# Patient Record
Sex: Female | Born: 1955
Health system: Southern US, Community
[De-identification: ages and names within clinical notes are randomized; demographics above are authoritative.]

## PROBLEM LIST (undated history)

## (undated) DIAGNOSIS — I10 Essential (primary) hypertension: Secondary | ICD-10-CM

## (undated) HISTORY — PX: DILATION AND CURETTAGE OF UTERUS: SHX78

## (undated) HISTORY — PX: EXPLORATORY LAPAROTOMY: SUR591

## (undated) HISTORY — PX: BREAST BIOPSY: SHX20

## (undated) HISTORY — PX: ECTOPIC PREGNANCY SURGERY: SHX613

## (undated) HISTORY — DX: Essential (primary) hypertension: I10

---

## 2018-10-04 ENCOUNTER — Telehealth: Payer: Self-pay | Admitting: Family Medicine

## 2018-10-04 ENCOUNTER — Telehealth: Payer: Self-pay

## 2018-10-04 NOTE — Telephone Encounter (Signed)
Copied from [place] [id number]. Topic: Appointment Scheduling - New Patient >> Oct 04, 2018  3:16 PM Berneta Levins wrote: New patient has been scheduled for your office. Provider: Dr. Nani Ravens Date of Appointment: 10/26/2018  Route to department's PEC pool.

## 2018-10-04 NOTE — Telephone Encounter (Signed)
Copied from [place] [id number]. Topic: Appointment Scheduling - New Patient >> Oct 04, 2018  3:16 PM Berneta Levins wrote: New patient has been scheduled for your office. Provider: Dr. Nani Ravens Date of Appointment: 10/26/2018  Route to department's PEC pool.

## 2018-10-04 NOTE — Telephone Encounter (Signed)
Copied from [place] [id number]. Topic: Appointment Scheduling - New Patient >> Oct 04, 2018  3:16 PM Berneta Levins wrote: New patient has been scheduled for your office. Provider: Dr. Nani Ravens Date of Appointment: 10/26/2018  Route to department's PEC pool.

## 2018-10-10 ENCOUNTER — Telehealth: Payer: Self-pay | Admitting: Family Medicine

## 2018-10-10 NOTE — Telephone Encounter (Signed)
Copied from [place] [phone]. Topic: Quick Communication - Rx Refill/Question >> Oct 10, 2018  3:37 PM Leward Quan A wrote: Medication: Amlodipine 5 mg tablets  Has the patient contacted their pharmacy? Yes.   (Agent: If no, request that the patient contact the pharmacy for the refill.) (Agent: If yes, when and what did the pharmacy advise?)  Preferred Pharmacy (with phone number or street name): [place], [place]. [phone] (Phone) [phone] (Fax)    Agent: Please be advised that RX refills may take up to 3 business days. We ask that you follow-up with your pharmacy.

## 2018-10-10 NOTE — Telephone Encounter (Signed)
Pt is scheduled for an OV to speak with Dr. Nani Ravens About medication until Pt has her New patient appt

## 2018-10-10 NOTE — Telephone Encounter (Signed)
Patient called, left VM to return call to the office to discuss medication request.

## 2018-10-13 ENCOUNTER — Encounter: Payer: Self-pay | Admitting: Family Medicine

## 2018-10-13 ENCOUNTER — Ambulatory Visit: Payer: 59 | Admitting: Family Medicine

## 2018-10-13 VITALS — BP 132/78 | HR 71 | Temp 97.5°F | Ht 66.0 in | Wt 137.5 lb

## 2018-10-13 DIAGNOSIS — E7800 Pure hypercholesterolemia, unspecified: Secondary | ICD-10-CM | POA: Insufficient documentation

## 2018-10-13 DIAGNOSIS — R7303 Prediabetes: Secondary | ICD-10-CM

## 2018-10-13 DIAGNOSIS — I10 Essential (primary) hypertension: Secondary | ICD-10-CM | POA: Insufficient documentation

## 2018-10-13 MED ORDER — AMLODIPINE BESYLATE 5 MG PO TABS: 5.0000 mg | ORAL_TABLET | Freq: Every day | ORAL | 3 refills | Status: DC

## 2018-10-13 NOTE — Progress Notes (Signed)
Chief Complaint  Patient presents with  . New Patient (Initial Visit)    Subjective Nancy Callahan is a 63 y.o. female who presents for hypertension follow up. She does not routinely monitor home blood pressures. Blood pressures ranging from 130's/70's on average. She is compliant with medication- Norvasc 5 mg/d. Patient has these side effects of medication: none She is not consistently adhering to a healthy diet overall. Current exercise: [employer] 2-3x/week  +hx of prediabetes.  +hx of hyperlipidemia. Has never been on a cholesterol medicine.    Past Medical History:  Diagnosis Date  . Hypertension     Review of Systems Cardiovascular: no chest pain Respiratory:  no shortness of breath  Exam BP 132/78 (BP Location: Left Arm, Patient Position: Sitting, Cuff Size: Normal)   Pulse 71   Temp (!) 97.5 F (36.4 C) (Oral)   Ht 5\' 6"  (1.676 m)   Wt 137 lb 8 oz (62.4 kg)   BMI 22.19 kg/m  General:  well developed, well nourished, in no apparent distress Heart: RRR, no bruits, no LE edema Lungs: clear to auscultation, no accessory muscle use Psych: well oriented with normal range of affect and appropriate judgment/insight  Essential hypertension - Plan: amLODipine (NORVASC) 5 MG tablet  Prediabetes  Pure hypercholesterolemia  Orders as above. Cont above. Counseled on diet and exercise. F/u in 2 weeks for CPE as originally scheduled. The patient voiced understanding and agreement to the plan.  [place], DO 10/13/18  1:54 PM

## 2018-10-13 NOTE — Patient Instructions (Addendum)
Keep the diet clean and stay active.  You can stop checking your blood pressure at home if you'd like.   Let us know if you need anything.

## 2018-10-13 NOTE — Progress Notes (Signed)
Pre visit review using our clinic review tool, if applicable. No additional management support is needed unless otherwise documented below in the visit note. 

## 2018-10-26 ENCOUNTER — Encounter: Payer: Self-pay | Admitting: Family Medicine

## 2018-10-26 ENCOUNTER — Ambulatory Visit: Payer: 59 | Admitting: Family Medicine

## 2018-10-26 VITALS — BP 118/80 | HR 64 | Temp 97.7°F | Ht 66.0 in | Wt 132.5 lb

## 2018-10-26 DIAGNOSIS — D369 Benign neoplasm, unspecified site: Secondary | ICD-10-CM | POA: Diagnosis not present

## 2018-10-26 DIAGNOSIS — Z0000 Encounter for general adult medical examination without abnormal findings: Secondary | ICD-10-CM

## 2018-10-26 DIAGNOSIS — Z1211 Encounter for screening for malignant neoplasm of colon: Secondary | ICD-10-CM

## 2018-10-26 LAB — COMPREHENSIVE METABOLIC PANEL
ALT: 21 U/L (ref 0–35)
AST: 17 U/L (ref 0–37)
Albumin: 4.7 g/dL (ref 3.5–5.2)
Alkaline Phosphatase: 68 U/L (ref 39–117)
BUN: 14 mg/dL (ref 6–23)
CO2: 31 mEq/L (ref 19–32)
CREATININE: 0.75 mg/dL (ref 0.40–1.20)
Calcium: 10 mg/dL (ref 8.4–10.5)
Chloride: 102 mEq/L (ref 96–112)
GFR: 78.66 mL/min (ref >60.00)
Glucose, Bld: 108 mg/dL — ABNORMAL HIGH (ref 70–99)
Potassium: 4.5 mEq/L (ref 3.5–5.1)
SODIUM: 140 meq/L (ref 135–145)
Total Bilirubin: 0.6 mg/dL (ref 0.2–1.2)
Total Protein: 7.8 g/dL (ref 6.0–8.3)

## 2018-10-26 LAB — LIPID PANEL
CHOL/HDL RATIO: 4
Cholesterol: 251 mg/dL — ABNORMAL HIGH (ref 0–200)
HDL: 56.6 mg/dL (ref >39.00)
LDL Cholesterol: 170 mg/dL — ABNORMAL HIGH (ref 0–99)
NonHDL: 194.46
Triglycerides: 123 mg/dL (ref 0.0–149.0)
VLDL: 24.6 mg/dL (ref 0.0–40.0)

## 2018-10-26 LAB — CBC
HCT: 41.3 % (ref 36.0–46.0)
Hemoglobin: 13.8 g/dL (ref 12.0–15.0)
MCHC: 33.5 g/dL (ref 30.0–36.0)
MCV: 93.7 fl (ref 78.0–100.0)
Platelets: 298 10*3/uL (ref 150.0–400.0)
RBC: 4.4 Mil/uL (ref 3.87–5.11)
RDW: 13 % (ref 11.5–15.5)
WBC: 5.7 10*3/uL (ref 4.0–10.5)

## 2018-10-26 NOTE — Progress Notes (Signed)
Pre visit review using our clinic review tool, if applicable. No additional management support is needed unless otherwise documented below in the visit note. 

## 2018-10-26 NOTE — Progress Notes (Signed)
Chief Complaint  Patient presents with  . New Patient (Initial Visit)     Well Woman Nancy Callahan is here for a complete physical.   Her last physical was >1 year ago.  Current diet: in general, diet is fair. Current exercise: [employer] 2-3x/week. Weight is down slightly and she denies awake-time fatigue. No LMP recorded. Seatbelt? Yes  Health Maintenance Pap/HPV- Yes- June, 2019 Mammogram- Yes- June, 2019 Tetanus- Yes Hep C screening- Yes- 2015 HIV screening- Yes- 2015 Colonoscopy- Never  Past Medical History:  Diagnosis Date  . Hypertension      Past Surgical History:  Procedure Laterality Date  . NO PAST SURGERIES      Medications  Current Outpatient Medications on File Prior to Visit  Medication Sig Dispense Refill  . amLODipine (NORVASC) 5 MG tablet Take 1 tablet (5 mg total) by mouth daily. 90 tablet 3   Allergies Allergies  Allergen Reactions  . Actonel [Risedronate Sodium] Hives    Review of Systems: Constitutional:  no unexpected weight changes Eye:  no recent significant change in vision Ear/Nose/Mouth/Throat:  Ears:  no tinnitus or vertigo and no recent change in hearing Nose/Mouth/Throat:  no complaints of nasal congestion, no sore throat Cardiovascular: no chest pain Respiratory:  no cough and no shortness of breath Gastrointestinal:  no abdominal pain, no change in bowel habits GU:  Female: negative for dysuria or pelvic pain Musculoskeletal/Extremities:  no pain of the joints Integumentary (Skin/Breast):  no abnormal skin lesions reported Neurologic:  no headaches Endocrine:  denies fatigue Hematologic/Lymphatic:  No areas of easy bleeding  Exam BP 118/80 (BP Location: Left Arm, Patient Position: Sitting, Cuff Size: Normal)   Pulse 64   Temp 97.7 F (36.5 C) (Oral)   Ht 5\' 6"  (1.676 m)   Wt 132 lb 8 oz (60.1 kg)   SpO2 97%   BMI 21.39 kg/m  General:  well developed, well nourished, in no apparent distress Skin:  no significant  moles, warts, or growths Head:  no masses, lesions, or tenderness Eyes:  pupils equal and round, sclera anicteric without injection Ears:  canals without lesions, TMs shiny without retraction, no obvious effusion, no erythema Nose:  nares patent, septum midline, mucosa normal, and no drainage or sinus tenderness Throat/Pharynx:  lips and gingiva without lesion; tongue and uvula midline; non-inflamed pharynx; no exudates or postnasal drainage Neck: neck supple without adenopathy, thyromegaly, or masses Lungs:  clear to auscultation, breath sounds equal bilaterally, no respiratory distress Cardio:  regular rate and rhythm, no bruits, no LE edema Abdomen:  abdomen soft, nontender; bowel sounds normal; no masses or organomegaly Genital: Defer to GYN Musculoskeletal:  symmetrical muscle groups noted without atrophy or deformity Extremities:  no clubbing, cyanosis, or edema, no deformities, no skin discoloration Neuro:  gait normal; deep tendon reflexes normal and symmetric Psych: well oriented with normal range of affect and appropriate judgment/insight  Assessment and Plan  Well adult exam - Plan: Lipid panel, CBC, Comprehensive metabolic panel  Dermoid cyst - Plan: US Transvaginal Non-OB, US Pelvis Complete  Screen for colon cancer - Plan: Ambulatory referral to Gastroenterology   Well 63 y.o. female. Counseled on diet and exercise. Other orders as above. Follow up in 6 mo or prn. The patient voiced understanding and agreement to the plan.  [place], DO 10/26/18 9:13 AM

## 2018-10-26 NOTE — Patient Instructions (Addendum)
Call Center for [place] at [facility] at [phone] for an appointment.  They are located at [address], [address], [place], [place], [address] (right across the hall from our office).  Give [place] 2-3 business days to get the results of your labs back.   If you do not hear anything about your referral in the next 1-2 weeks, call our office and ask for an update.  Keep the diet clean and stay active.  Let us know if you need anything.

## 2018-10-27 ENCOUNTER — Other Ambulatory Visit (INDEPENDENT_AMBULATORY_CARE_PROVIDER_SITE_OTHER): Payer: 59

## 2018-10-27 ENCOUNTER — Other Ambulatory Visit: Payer: Self-pay | Admitting: Family Medicine

## 2018-10-27 DIAGNOSIS — R739 Hyperglycemia, unspecified: Secondary | ICD-10-CM

## 2018-10-27 DIAGNOSIS — E785 Hyperlipidemia, unspecified: Secondary | ICD-10-CM

## 2018-10-27 LAB — HEMOGLOBIN A1C: Hgb A1c MFr Bld: 6.2 % (ref 4.6–6.5)

## 2018-11-01 ENCOUNTER — Ambulatory Visit (HOSPITAL_BASED_OUTPATIENT_CLINIC_OR_DEPARTMENT_OTHER): Payer: 59

## 2018-11-01 ENCOUNTER — Encounter (HOSPITAL_BASED_OUTPATIENT_CLINIC_OR_DEPARTMENT_OTHER): Payer: Self-pay

## 2018-11-01 ENCOUNTER — Ambulatory Visit (HOSPITAL_BASED_OUTPATIENT_CLINIC_OR_DEPARTMENT_OTHER)
Admission: RE | Admit: 2018-11-01 | Discharge: 2018-11-01 | Disposition: A | Discharge status: Home / Self Care | Payer: 59 | Source: Ambulatory Visit | Attending: Family Medicine | Admitting: Family Medicine

## 2018-11-01 DIAGNOSIS — D369 Benign neoplasm, unspecified site: Secondary | ICD-10-CM | POA: Diagnosis not present

## 2018-11-01 DIAGNOSIS — D259 Leiomyoma of uterus, unspecified: Secondary | ICD-10-CM | POA: Diagnosis not present

## 2018-11-16 ENCOUNTER — Other Ambulatory Visit: Payer: Self-pay | Admitting: Family Medicine

## 2018-11-16 DIAGNOSIS — I10 Essential (primary) hypertension: Secondary | ICD-10-CM

## 2018-11-16 MED ORDER — AMLODIPINE BESYLATE 5 MG PO TABS: 5.0000 mg | ORAL_TABLET | Freq: Every day | ORAL | 3 refills | Status: DC

## 2018-11-16 NOTE — Telephone Encounter (Signed)
Copied from [place] [phone]. Topic: Quick Communication - Rx Refill/Question >> Nov 16, 2018 10:46 AM Alanda Slim E wrote: Medication: amLODipine (NORVASC) 5 MG tablet - Pt needs this to be called into a different pharmacy due to cost   Has the patient contacted their pharmacy? Yes  Preferred Pharmacy (with phone number or street name): [place], [place]. [phone] (Phone) [phone] (Fax)    Agent: Please be advised that RX refills may take up to 3 business days. We ask that you follow-up with your pharmacy.

## 2018-11-18 MED FILL — AMLODIPINE BESYLATE 5 MG TA: 5 | 90 days supply | Qty: 90 | Fill #0

## 2018-11-29 ENCOUNTER — Encounter: Payer: Self-pay | Admitting: Family Medicine

## 2019-02-01 ENCOUNTER — Telehealth (HOSPITAL_COMMUNITY): Payer: Self-pay | Admitting: Emergency Medicine

## 2019-02-01 ENCOUNTER — Encounter (HOSPITAL_COMMUNITY): Payer: Self-pay

## 2019-02-01 ENCOUNTER — Ambulatory Visit (HOSPITAL_COMMUNITY)
Admission: EM | Admit: 2019-02-01 | Discharge: 2019-02-01 | Disposition: A | Discharge status: Home / Self Care | Payer: 59 | Attending: Family Medicine | Admitting: Family Medicine

## 2019-02-01 ENCOUNTER — Other Ambulatory Visit: Payer: Self-pay

## 2019-02-01 DIAGNOSIS — K2900 Acute gastritis without bleeding: Secondary | ICD-10-CM | POA: Insufficient documentation

## 2019-02-01 DIAGNOSIS — K219 Gastro-esophageal reflux disease without esophagitis: Secondary | ICD-10-CM | POA: Insufficient documentation

## 2019-02-01 LAB — COMPREHENSIVE METABOLIC PANEL WITH GFR
ALT: 44 U/L (ref 0–44)
AST: 32 U/L (ref 15–41)
Albumin: 4.5 g/dL (ref 3.5–5.0)
Alkaline Phosphatase: 62 U/L (ref 38–126)
Anion gap: 14 (ref 5–15)
BUN: 10 mg/dL (ref 6–20)
CO2: 25 mmol/L (ref 22–32)
Calcium: 10.1 mg/dL (ref 8.9–10.3)
Chloride: 101 mmol/L (ref 98–111)
Creatinine, Ser: 0.68 mg/dL (ref 0.44–1.00)
GFR calc Af Amer: >60 mL/min
GFR calc non Af Amer: >60 mL/min
Glucose, Bld: 111 mg/dL — ABNORMAL HIGH (ref 70–99)
Potassium: 3.4 mmol/L — ABNORMAL LOW (ref 3.5–5.1)
Sodium: 140 mmol/L (ref 135–145)
Total Bilirubin: 0.4 mg/dL (ref 0.3–1.2)
Total Protein: 8.4 g/dL — ABNORMAL HIGH (ref 6.5–8.1)

## 2019-02-01 LAB — LIPASE, BLOOD: Lipase: 43 U/L (ref 11–51)

## 2019-02-01 MED ORDER — OMEPRAZOLE 20 MG PO CPDR: 20.0000 mg | DELAYED_RELEASE_CAPSULE | Freq: Every day | ORAL | 0 refills | Status: DC

## 2019-02-01 NOTE — Telephone Encounter (Signed)
Pharmacy change

## 2019-02-01 NOTE — Telephone Encounter (Signed)
Attempted to reach patient. No answer at this time. Voicemail left.    

## 2019-02-01 NOTE — ED Provider Notes (Signed)
[facility]    CSN: [id number] Arrival date & time: 02/01/19  0827     History   Chief Complaint Chief Complaint  Patient presents with  . Abdominal Pain    HPI Nancy Callahan is a 64 y.o. female.   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 She states that she has had increased epigastric pain for a couple of days.  She tried some Tums.  Did not go away.  She feels acid up into her throat.  It is worse when she lies down. Not related to foods.  She does not eat a lot of highly spiced or fatty foods.  She does not take any NSAID use.  She does not smoke or drink.  She does admit to stress. No nausea or vomiting.  No generalized abdominal pain.  No constipation or diarrhea. Sometimes the pain radiates to her mid back. She states that she is healthy.  Hypertension on medication.  She states that she had gastritis years ago while in school.  This feels similar   Past Medical History:  Diagnosis Date  . Hypertension    on medication    Patient Active Problem List   Diagnosis Date Noted  . Essential hypertension 10/13/2018  . Prediabetes 10/13/2018  . Pure hypercholesterolemia 10/13/2018    Past Surgical History:  Procedure Laterality Date  . DILATION AND CURETTAGE OF UTERUS     spontaneous abortion  . ECTOPIC PREGNANCY SURGERY Right   . EXPLORATORY LAPAROTOMY     to evaluate infertility    OB History    Gravida  4   Para  0   Term      Preterm      AB  4   Living  0     SAB  2   TAB      Ectopic  2   Multiple      Live Births               Home Medications    Prior to Admission medications   Medication Sig Start Date End Date Taking? Authorizing Provider  amLODipine (NORVASC) 5 MG tablet Take 1 tablet (5 mg total) by mouth daily. 11/16/18   Shelda Pal, DO  omeprazole (PRILOSEC) 20 MG capsule Take 1 capsule (20 mg total) by mouth daily. 02/01/19   Raylene Everts, MD    Family History  Family History  Problem Relation Age of Onset  . Hypertension Mother   . Hyperlipidemia Mother   . Heart disease Mother   . Heart disease Father   . Hyperlipidemia Father   . Hypertension Father   . Diabetes Father     Social History Social History   Tobacco Use  . Smoking status: Never Smoker  . Smokeless tobacco: Never Used  Substance Use Topics  . Alcohol use: Never    Frequency: Never  . Drug use: Never     Allergies   Actonel [risedronate sodium]   Review of Systems Review of Systems  Constitutional: Negative for chills and fever.  HENT: Negative for ear pain and sore throat.   Eyes: Negative for pain and visual disturbance.  Respiratory: Negative for cough and shortness of breath.   Cardiovascular: Negative for chest pain and palpitations.  Gastrointestinal: Positive for abdominal pain. Negative for vomiting.  Genitourinary: Negative for dysuria and hematuria.  Musculoskeletal: Negative for arthralgias and back pain.  Skin: Negative for color change and  rash.  Neurological: Negative for seizures and syncope.  All other systems reviewed and are negative.    Physical Exam Triage Vital Signs ED Triage Vitals  Enc Vitals Group     BP 02/01/19 0842 (!) 151/82     Pulse Rate 02/01/19 0842 83     Resp 02/01/19 0842 18     Temp 02/01/19 0842 (!) 97.4 F (36.3 C)     Temp src --      SpO2 02/01/19 0842 96 %     Weight --      Height --      Head Circumference --      Peak Flow --      Pain Score 02/01/19 0840 8     Pain Loc --      Pain Edu? --      Excl. in [place]? --    No data found.  Updated Vital Signs BP (!) 151/82   Pulse 83   Temp (!) 97.4 F (36.3 C)   Resp 18   LMP  (LMP Unknown)   SpO2 96%     Physical Exam Constitutional:      General: She is not in acute distress.    Appearance: She is well-developed and normal weight.  HENT:     Head: Normocephalic and atraumatic.     Mouth/Throat:     Mouth: Mucous membranes are moist.  Eyes:      Conjunctiva/sclera: Conjunctivae normal.     Pupils: Pupils are equal, round, and reactive to light.  Neck:     Musculoskeletal: Normal range of motion.  Cardiovascular:     Rate and Rhythm: Normal rate and regular rhythm.     Heart sounds: Normal heart sounds.  Pulmonary:     Effort: Pulmonary effort is normal. No respiratory distress.     Breath sounds: Normal breath sounds.  Abdominal:     General: Abdomen is flat. Bowel sounds are normal. There is no distension.     Palpations: Abdomen is soft. There is no hepatomegaly or mass.     Tenderness: There is abdominal tenderness in the epigastric area. There is no guarding or rebound.  Musculoskeletal: Normal range of motion.  Skin:    General: Skin is warm and dry.  Neurological:     General: No focal deficit present.     Mental Status: She is alert.  Psychiatric:        Mood and Affect: Mood normal.        Behavior: Behavior normal.      UC Treatments / Results  Labs (all labs ordered are listed, but only abnormal results are displayed) Labs Reviewed  COMPREHENSIVE METABOLIC PANEL - Abnormal; Notable for the following components:      Result Value   Potassium 3.4 (*)    Glucose, Bld 111 (*)    Total Protein 8.4 (*)    All other components within normal limits  LIPASE, BLOOD    EKG None  Radiology No results found.  Procedures Procedures (including critical care time)  Medications Ordered in UC Medications - No data to display  Initial Impression / Assessment and Plan / UC Course  I have reviewed the triage vital signs and the nursing notes.  Pertinent labs & imaging results that were available during my care of the patient were reviewed by me and considered in my medical decision making (see chart for details).     Believe patient has GERD.  Her acute pain is likely  due to gastritis from recent stresses.  I recommended she take a PPI.  Follow-up with your PCP if she fails to improve.Patient would like  to be checked to ensure she does not everything more with her "pancreas or gallbladder.  I will draw blood work and call her with results. Final Clinical Impressions(s) / UC Diagnoses   Final diagnoses:  Acute gastritis without hemorrhage, unspecified gastritis type  Gastroesophageal reflux disease, esophagitis presence not specified     Discharge Instructions     I have ordered blood work.  I will call you with results when they are available.  Make sure we have an accurate number for you I would like for you to start on omeprazole 20 mg a day.  I have written a prescription and sent it to your pharmacy.  Take this on an empty stomach. This will start working in a day or 2.  In the meantime you can continue with Tums or antacids as needed Avoid fried food and highly spiced food while your stomach is upset Call your primary care doctor if you do not see improvement within the week   ED Prescriptions    Medication Sig Dispense Auth. Provider   omeprazole (PRILOSEC) 20 MG capsule Take 1 capsule (20 mg total) by mouth daily. 30 capsule Raylene Everts, MD     Controlled Substance Prescriptions [place] Controlled Substance Registry consulted? Not Applicable   Raylene Everts, MD 02/01/19 1030

## 2019-02-01 NOTE — ED Triage Notes (Signed)
Pt has c/o of epigastric pain that radiates to back that began yesterday, pt also states she has been belching often

## 2019-02-01 NOTE — Telephone Encounter (Signed)
Patient contacted and made aware of all results, all questions answered.   

## 2019-02-01 NOTE — Discharge Instructions (Signed)
I have ordered blood work.  I will call you with results when they are available.  Make sure we have an accurate number for you I would like for you to start on omeprazole 20 mg a day.  I have written a prescription and sent it to your pharmacy.  Take this on an empty stomach. This will start working in a day or 2.  In the meantime you can continue with Tums or antacids as needed Avoid fried food and highly spiced food while your stomach is upset Call your primary care doctor if you do not see improvement within the week

## 2019-02-04 ENCOUNTER — Telehealth (HOSPITAL_COMMUNITY): Payer: Self-pay | Admitting: Family Medicine

## 2019-02-04 MED ORDER — SUCRALFATE 1 G PO TABS: 1.0000 g | ORAL_TABLET | Freq: Four times a day (QID) | ORAL | 0 refills | Status: DC

## 2019-02-04 MED ORDER — PANTOPRAZOLE SODIUM 20 MG PO TBEC: 20.0000 mg | DELAYED_RELEASE_TABLET | Freq: Two times a day (BID) | ORAL | 0 refills | Status: DC

## 2019-02-04 NOTE — Telephone Encounter (Signed)
PT reports omeprazole is not working and she has developed presumed side effects. Requests another med. VO from Dr. Meda Coffee for carafate and pantoprazole. Meds will be sent to [facility] per PT request. PT called back and made aware of new meds, instructed to start tomorrow and discontinue omeprazole today. PT acknowledges and has no further questions

## 2019-02-26 MED FILL — OMEPRAZOLE 20 MG CPDR: 20 | 30 days supply | Qty: 30 | Fill #0

## 2019-03-13 DIAGNOSIS — Z01419 Encounter for gynecological examination (general) (routine) without abnormal findings: Secondary | ICD-10-CM | POA: Diagnosis not present

## 2019-03-27 MED FILL — AMLODIPINE BESYLATE 5 MG TA: 5 | 90 days supply | Qty: 90 | Fill #1

## 2019-04-11 ENCOUNTER — Other Ambulatory Visit: Payer: Self-pay | Admitting: Family Medicine

## 2019-04-11 DIAGNOSIS — Z1231 Encounter for screening mammogram for malignant neoplasm of breast: Secondary | ICD-10-CM

## 2019-05-04 ENCOUNTER — Ambulatory Visit: Payer: 59 | Admitting: Family Medicine

## 2019-05-04 ENCOUNTER — Encounter: Payer: Self-pay | Admitting: Family Medicine

## 2019-05-04 ENCOUNTER — Other Ambulatory Visit: Payer: Self-pay

## 2019-05-04 VITALS — BP 122/70 | HR 71 | Temp 97.8°F | Ht 66.0 in | Wt 139.1 lb

## 2019-05-04 DIAGNOSIS — R7303 Prediabetes: Secondary | ICD-10-CM | POA: Diagnosis not present

## 2019-05-04 DIAGNOSIS — Z83511 Family history of glaucoma: Secondary | ICD-10-CM

## 2019-05-04 DIAGNOSIS — K219 Gastro-esophageal reflux disease without esophagitis: Secondary | ICD-10-CM | POA: Diagnosis not present

## 2019-05-04 DIAGNOSIS — I10 Essential (primary) hypertension: Secondary | ICD-10-CM | POA: Diagnosis not present

## 2019-05-04 MED ORDER — FAMOTIDINE 20 MG PO TABS: ORAL_TABLET | ORAL | 2 refills | Status: DC

## 2019-05-04 NOTE — Patient Instructions (Addendum)
Take the Pepcid as needed. If not helpful, let me know.   If you do not hear anything about your referral in the next 1-2 weeks, call our office and ask for an update.  Keep the diet clean and stay active.  Let us know if you need anything.

## 2019-05-04 NOTE — Progress Notes (Signed)
Chief Complaint  Patient presents with  . Follow-up    Subjective Nancy Callahan is a 63 y.o. female who presents for hypertension follow up. She does not monitor home blood pressures. She is compliant with medications- Norvasc 5 mg/d. Patient has these side effects of medication: none She is usually adhering to a healthy diet overall. Current exercise: some walking, active at work  Hx of GERD. She was rx'd pantoprazole which did help. She will still get intermittent s/s's. Has not been taking anything recently. No weight loss.   Famhx of glaucoma. She has been getting her eyes checked every 6 mo. She needs a new in-network provider.    Past Medical History:  Diagnosis Date  . Hypertension    on medication    Review of Systems Cardiovascular: no chest pain Respiratory:  no shortness of breath  Exam BP 122/70 (BP Location: Left Arm, Patient Position: Sitting, Cuff Size: Normal)   Pulse 71   Temp 97.8 F (36.6 C) (Oral)   Ht 5\' 6"  (1.676 m)   Wt 139 lb 2 oz (63.1 kg)   LMP  (LMP Unknown)   SpO2 97%   BMI 22.46 kg/m  General:  well developed, well nourished, in no apparent distress Heart: RRR, no bruits, no LE edema Lungs: clear to auscultation, no accessory muscle use Psych: well oriented with normal range of affect and appropriate judgment/insight  Essential hypertension - Plan: Cont Norvasc  Gastroesophageal reflux disease, esophagitis presence not specified - Plan: pepcid 20 mg bid prn. If this does not help, will call in prn Protonix. Counseled on diet.   Family history of glaucoma - Plan: Ambulatory referral to Ophthalmology  Prediabtes - Plan: Hemoglobin A1c  Orders as above. Counseled on diet and exercise. F/u in 6 mo for CPE, earliest convenience for labs. The patient voiced understanding and agreement to the plan.  [place], DO 05/04/19  4:43 PM

## 2019-05-11 ENCOUNTER — Other Ambulatory Visit: Payer: Self-pay

## 2019-05-11 ENCOUNTER — Other Ambulatory Visit (INDEPENDENT_AMBULATORY_CARE_PROVIDER_SITE_OTHER): Payer: 59

## 2019-05-11 DIAGNOSIS — E785 Hyperlipidemia, unspecified: Secondary | ICD-10-CM | POA: Diagnosis not present

## 2019-05-11 DIAGNOSIS — R7303 Prediabetes: Secondary | ICD-10-CM

## 2019-05-11 LAB — LIPID PANEL
Cholesterol: 248 mg/dL — ABNORMAL HIGH (ref 0–200)
HDL: 51.7 mg/dL (ref >39.00)
NonHDL: 196.34
Total CHOL/HDL Ratio: 5
Triglycerides: 220 mg/dL — ABNORMAL HIGH (ref 0.0–149.0)
VLDL: 44 mg/dL — ABNORMAL HIGH (ref 0.0–40.0)

## 2019-05-11 LAB — LDL CHOLESTEROL, DIRECT: Direct LDL: 161 mg/dL

## 2019-05-11 LAB — HEMOGLOBIN A1C: Hgb A1c MFr Bld: 6.3 % (ref 4.6–6.5)

## 2019-05-14 ENCOUNTER — Other Ambulatory Visit: Payer: Self-pay | Admitting: Family Medicine

## 2019-05-14 ENCOUNTER — Telehealth: Payer: Self-pay | Admitting: Family Medicine

## 2019-05-14 DIAGNOSIS — E785 Hyperlipidemia, unspecified: Secondary | ICD-10-CM

## 2019-05-14 NOTE — Telephone Encounter (Signed)
See results notes. 

## 2019-05-24 ENCOUNTER — Ambulatory Visit
Admission: RE | Admit: 2019-05-24 | Discharge: 2019-05-24 | Disposition: A | Discharge status: Home / Self Care | Payer: 59 | Source: Ambulatory Visit | Attending: Family Medicine | Admitting: Family Medicine

## 2019-05-24 ENCOUNTER — Other Ambulatory Visit: Payer: Self-pay

## 2019-05-24 DIAGNOSIS — Z1231 Encounter for screening mammogram for malignant neoplasm of breast: Secondary | ICD-10-CM

## 2019-05-29 DIAGNOSIS — H5203 Hypermetropia, bilateral: Secondary | ICD-10-CM | POA: Diagnosis not present

## 2019-05-29 DIAGNOSIS — H40033 Anatomical narrow angle, bilateral: Secondary | ICD-10-CM | POA: Diagnosis not present

## 2019-07-02 MED FILL — AMLODIPINE BESYLATE 5 MG TA: 5 | 90 days supply | Qty: 90 | Fill #2

## 2019-07-20 MED FILL — AMLODIPINE BESYLATE 5 MG TA: 5 | 90 days supply | Qty: 90 | Fill #2

## 2019-09-10 DIAGNOSIS — H43812 Vitreous degeneration, left eye: Secondary | ICD-10-CM | POA: Diagnosis not present

## 2019-10-16 DIAGNOSIS — H43812 Vitreous degeneration, left eye: Secondary | ICD-10-CM | POA: Diagnosis not present

## 2019-11-01 MED FILL — AMLODIPINE BESYLATE 5 MG TA: 5 | 90 days supply | Qty: 90 | Fill #3

## 2019-12-10 ENCOUNTER — Other Ambulatory Visit: Payer: Self-pay

## 2019-12-11 ENCOUNTER — Ambulatory Visit (INDEPENDENT_AMBULATORY_CARE_PROVIDER_SITE_OTHER): Payer: 59 | Admitting: Family Medicine

## 2019-12-11 ENCOUNTER — Encounter: Payer: Self-pay | Admitting: Family Medicine

## 2019-12-11 ENCOUNTER — Other Ambulatory Visit: Payer: Self-pay | Admitting: Family Medicine

## 2019-12-11 ENCOUNTER — Other Ambulatory Visit: Payer: Self-pay

## 2019-12-11 VITALS — BP 128/66 | HR 78 | Temp 96.9°F | Ht 66.0 in | Wt 138.5 lb

## 2019-12-11 DIAGNOSIS — Z1211 Encounter for screening for malignant neoplasm of colon: Secondary | ICD-10-CM | POA: Diagnosis not present

## 2019-12-11 DIAGNOSIS — M79641 Pain in right hand: Secondary | ICD-10-CM | POA: Diagnosis not present

## 2019-12-11 DIAGNOSIS — Z0000 Encounter for general adult medical examination without abnormal findings: Secondary | ICD-10-CM

## 2019-12-11 DIAGNOSIS — M79642 Pain in left hand: Secondary | ICD-10-CM | POA: Diagnosis not present

## 2019-12-11 DIAGNOSIS — I10 Essential (primary) hypertension: Secondary | ICD-10-CM

## 2019-12-11 MED ORDER — AMLODIPINE BESYLATE 5 MG PO TABS: 5.0000 mg | ORAL_TABLET | Freq: Every day | ORAL | 3 refills | Status: DC

## 2019-12-11 NOTE — Progress Notes (Signed)
Chief Complaint  Patient presents with  . Annual Exam     Well Woman Nancy Callahan is here for a complete physical.   Her last physical was >1 year ago.  Current diet: in general, Diet is fair. Current exercise: HIIT Weight is stable and she denies daytime fatigue. Seatbelt? Yes  Health Maintenance Pap/HPV- Yes Mammogram- Yes Colon cancer screening-No Shingrix- No Tetanus- No Hep C screening- Yes HIV screening- Yes   B/l hand pain for severla months at all of her knuckles. +famhx for psoriatic arthritis. No inj/change in activity to her hands. Questioning gout or AI issue. No current swelling or redness. Skin also feels tight in her legs, questioning scleroderma.   Past Medical History:  Diagnosis Date  . Hypertension    on medication     Past Surgical History:  Procedure Laterality Date  . DILATION AND CURETTAGE OF UTERUS     spontaneous abortion  . ECTOPIC PREGNANCY SURGERY Right   . EXPLORATORY LAPAROTOMY     to evaluate infertility    Medications  Current Outpatient Medications on File Prior to Visit  Medication Sig Dispense Refill  . amLODipine (NORVASC) 5 MG tablet Take 1 tablet (5 mg total) by mouth daily. 90 tablet 3    Allergies Allergies  Allergen Reactions  . Actonel [Risedronate Sodium] Hives    Review of Systems: Constitutional:  no unexpected weight changes Eye:  no recent significant change in vision Ear/Nose/Mouth/Throat:  Ears:  no recent change in hearing Nose/Mouth/Throat:  no complaints of nasal congestion, no sore throat Cardiovascular: no chest pain Respiratory:  no shortness of breath Gastrointestinal:  no abdominal pain, no change in bowel habits GU:  Female: negative for dysuria or pelvic pain Musculoskeletal/Extremities: +hand pain; otherwise no pain of the joints Integumentary (Skin/Breast):  no abnormal skin lesions reported Neurologic:  no headaches Endocrine:  denies fatigue Hematologic/Lymphatic:  No areas of easy  bleeding  Exam BP 128/66 (BP Location: Left Arm, Patient Position: Sitting, Cuff Size: Normal)   Pulse 78   Temp (!) 96.9 F (36.1 C) (Temporal)   Ht 5\' 6"  (1.676 m)   Wt 138 lb 8 oz (62.8 kg)   LMP  (LMP Unknown)   SpO2 97%   BMI 22.35 kg/m  General:  well developed, well nourished, in no apparent distress Skin:  no significant moles, warts, or growths Head:  no masses, lesions, or tenderness Eyes:  pupils equal and round, sclera anicteric without injection Ears:  canals without lesions, TMs shiny without retraction, no obvious effusion, no erythema Nose:  nares patent, septum midline, mucosa normal, and no drainage or sinus tenderness Throat/Pharynx:  lips and gingiva without lesion; tongue and uvula midline; non-inflamed pharynx; no exudates or postnasal drainage Neck: neck supple without adenopathy, thyromegaly, or masses Lungs:  clear to auscultation, breath sounds equal bilaterally, no respiratory distress Cardio:  regular rate and rhythm, no LE edema Abdomen:  abdomen soft, nontender; bowel sounds normal; no masses or organomegaly Genital: Defer to GYN Musculoskeletal:  symmetrical muscle groups noted without atrophy or deformity Extremities:  no clubbing, cyanosis, or edema, no deformities, no skin discoloration Neuro:  gait normal; deep tendon reflexes normal and symmetric Psych: well oriented with normal range of affect and appropriate judgment/insight  Assessment and Plan  Well adult exam - Plan: Comprehensive metabolic panel, CBC, Lipid panel  Screen for colon cancer - Plan: Ambulatory referral to Gastroenterology  Essential hypertension - Plan: amLODipine (NORVASC) 5 MG tablet  Pain in both hands  Well 64 y.o. female. Counseled on diet and exercise. Other orders as above. Pt will ck w ins regarding her deductible. Follow up in 6 mo. The patient voiced understanding and agreement to the plan.  [place], DO 12/11/19 9:32 AM

## 2019-12-11 NOTE — Patient Instructions (Addendum)
Give [place] 2-3 business days to get the results of your labs back.   Keep the diet clean and stay active.  Take a spoonful of pickle juice before bed if you are having issues with cramping.   You can get your shots 2 weeks after the 2nd covid vaccine.   The new Shingrix vaccine (for shingles) is a 2 shot series. It can make people feel low energy, achy and almost like they have the flu for 48 hours after injection. Please plan accordingly when deciding on when to get this shot. Call our office for a nurse visit appointment to get this. The second shot of the series is less severe regarding the side effects, but it still lasts 48 hours.   Let us know if you need anything.   ANA Rheumatoid Factor Anti-CCP Anti-dsDNA Anti-smith Anti-centromere Sedimentation rate C reactive protein Anti-SSA Anti-SSB Uric acid

## 2019-12-17 ENCOUNTER — Other Ambulatory Visit: Payer: Self-pay

## 2019-12-17 ENCOUNTER — Other Ambulatory Visit (INDEPENDENT_AMBULATORY_CARE_PROVIDER_SITE_OTHER): Payer: 59

## 2019-12-17 DIAGNOSIS — E785 Hyperlipidemia, unspecified: Secondary | ICD-10-CM

## 2019-12-17 LAB — LIPID PANEL
Cholesterol: 210 mg/dL — ABNORMAL HIGH (ref 0–200)
HDL: 55.8 mg/dL (ref >39.00)
LDL Cholesterol: 129 mg/dL — ABNORMAL HIGH (ref 0–99)
NonHDL: 154.31
Total CHOL/HDL Ratio: 4
Triglycerides: 125 mg/dL (ref 0.0–149.0)
VLDL: 25 mg/dL (ref 0.0–40.0)

## 2020-01-11 DIAGNOSIS — H40013 Open angle with borderline findings, low risk, bilateral: Secondary | ICD-10-CM | POA: Diagnosis not present

## 2020-01-11 DIAGNOSIS — H40033 Anatomical narrow angle, bilateral: Secondary | ICD-10-CM | POA: Diagnosis not present

## 2020-02-11 ENCOUNTER — Encounter: Payer: Self-pay | Admitting: Family Medicine

## 2020-02-27 MED FILL — AMLODIPINE BESYLATE 5 MG TA: 5 | 90 days supply | Qty: 90 | Fill #0

## 2020-02-29 ENCOUNTER — Ambulatory Visit: Payer: 59 | Admitting: Family Medicine

## 2020-03-04 ENCOUNTER — Ambulatory Visit: Payer: 59 | Admitting: Family Medicine

## 2020-03-10 ENCOUNTER — Ambulatory Visit: Payer: 59 | Admitting: Family Medicine

## 2020-03-11 ENCOUNTER — Encounter: Payer: Self-pay | Admitting: Family Medicine

## 2020-03-11 ENCOUNTER — Other Ambulatory Visit: Payer: Self-pay

## 2020-03-11 ENCOUNTER — Ambulatory Visit: Payer: 59 | Admitting: Family Medicine

## 2020-03-11 VITALS — BP 118/76 | HR 79 | Temp 97.0°F | Ht 66.0 in | Wt 137.5 lb

## 2020-03-11 DIAGNOSIS — M65331 Trigger finger, right middle finger: Secondary | ICD-10-CM | POA: Diagnosis not present

## 2020-03-11 MED ORDER — MELOXICAM 7.5 MG PO TABS: 7.5000 mg | ORAL_TABLET | Freq: Every day | ORAL | 0 refills | Status: DC

## 2020-03-11 MED FILL — MELOXICAM 7.5 MG TABLET: 7.5 | 30 days supply | Qty: 30 | Fill #0

## 2020-03-11 NOTE — Patient Instructions (Signed)
Trigger Finger  Trigger finger, also called stenosing tenosynovitis,  is a condition that causes a finger to get stuck in a bent position. Each finger has a tendon, which is a tough, cord-like tissue that connects muscle to bone, and each tendon passes through a tunnel of tissue called a tendon sheath. To move your finger, your tendon needs to glide freely through the sheath. Trigger finger happens when the tendon or the sheath thickens, making it difficult to move your finger. Trigger finger can affect any finger or a thumb. It may affect more than one finger. Mild cases may clear up with rest and medicine. Severe cases require more treatment. What are the causes? Trigger finger is caused by a thickened finger tendon or tendon sheath. The cause of this thickening is not known. What increases the risk? The following factors may make you more likely to develop this condition:  Doing activities that require a strong grip.  Having rheumatoid arthritis, gout, or diabetes.  Being 40-60 years old.  Being female. What are the signs or symptoms? Symptoms of this condition include:  Pain when bending or straightening your finger.  Tenderness or swelling where your finger attaches to the palm of your hand.  A lump in the palm of your hand or on the inside of your finger.  Hearing a noise like a pop or a snap when you try to straighten your finger.  Feeling a catching or locking sensation when you try to straighten your finger.  Being unable to straighten your finger. How is this diagnosed? This condition is diagnosed based on your symptoms and a physical exam. How is this treated? This condition may be treated by:  Resting your finger and avoiding activities that make symptoms worse.  Wearing a finger splint to keep your finger extended.  Taking NSAIDs, such as ibuprofen, to relieve pain and swelling.  Doing gentle exercises to stretch the finger as told by your health care provider.   Having medicine that reduces swelling and inflammation (steroids) injected into the tendon sheath. Injections may need to be repeated.  Having surgery to open the tendon sheath. This may be done if other treatments do not work and you cannot straighten your finger. You may need physical therapy after surgery. Follow these instructions at home: If you have a splint:  Wear the splint as told by your health care provider. Remove it only as told by your health care provider.  Loosen it if your fingers tingle, become numb, or turn cold and blue.  Keep it clean.  If the splint is not waterproof: ? Do not let it get wet. ? Cover it with a watertight covering when you take a bath or shower. Managing pain, stiffness, and swelling     If directed, apply heat to the affected area as often as told by your health care provider. Use the heat source that your health care provider recommends, such as a moist heat pack or a heating pad.  Place a towel between your skin and the heat source.  Leave the heat on for 20-30 minutes.  Remove the heat if your skin turns bright red. This is especially important if you are unable to feel pain, heat, or cold. You may have a greater risk of getting burned. If directed, put ice on the painful area. To do this:  If you have a removable splint, remove it as told by your health care provider.  Put ice in a plastic bag.  Place a   towel between your skin and the bag or between your splint and the bag.  Leave the ice on for 20 minutes, 2-3 times a day.  Activity  Rest your finger as told by your health care provider. Avoid activities that make the pain worse.  Return to your normal activities as told by your health care provider. Ask your health care provider what activities are safe for you.  Do exercises as told by your health care provider.  Ask your health care provider when it is safe to drive if you have a splint on your hand. General instructions   Take over-the-counter and prescription medicines only as told by your health care provider.  Keep all follow-up visits as told by your health care provider. This is important. Contact a health care provider if:  Your symptoms are not improving with home care. Summary  Trigger finger, also called stenosing tenosynovitis, causes your finger to get stuck in a bent position. This can make it difficult and painful to straighten your finger.  This condition develops when a finger tendon or tendon sheath thickens.  Treatment may include resting your finger, wearing a splint, and taking medicines.  In severe cases, surgery to open the tendon sheath may be needed. This information is not intended to replace advice given to you by your health care provider. Make sure you discuss any questions you have with your health care provider. Document Revised: 01/29/2019 Document Reviewed: 01/29/2019 Elsevier Patient Education  2020 [employer]  

## 2020-03-11 NOTE — Progress Notes (Signed)
Musculoskeletal Exam  Patient: Nancy Callahan DOB: 10-19-1955  DOS: 03/11/2020  SUBJECTIVE:  Chief Complaint:   Chief Complaint  Patient presents with  . Hand Pain    right middle    Nancy Callahan is a 64 y.o.  female for evaluation and treatment of R hand pain.   Onset:  2 months ago. No inj or change in activity.  Location: Palm of middle finger Character:  aching and tight band  Progression of issue: worsened slightly Associated symptoms: triggering Treatment: to date has been stretching.   Neurovascular symptoms: no  Past Medical History:  Diagnosis Date  . Hypertension    on medication    Objective: VITAL SIGNS: BP 118/76 (BP Location: Left Arm, Patient Position: Sitting, Cuff Size: Normal)   Pulse 79   Temp (!) 97 F (36.1 C) (Temporal)   Ht 5\' 6"  (1.676 m)   Wt 137 lb 8 oz (62.4 kg)   LMP  (LMP Unknown)   SpO2 96%   BMI 22.19 kg/m  Constitutional: Well formed, well developed. No acute distress. Cardiovascular: Brisk cap refill Thorax & Lungs: No accessory muscle use Musculoskeletal: R hand.   Normal active range of motion: yes.   Normal passive range of motion: yes Tenderness to palpation: mild ttp over the flexor tendon over palmar MCP Deformity: no Ecchymosis: no Neurologic: Normal sensory function. Psychiatric: Normal mood. Age appropriate judgment and insight. Alert & oriented x 3.    Assessment:  Trigger middle finger of right hand - Plan: meloxicam (MOBIC) 7.5 MG tablet  Plan: Trial NSAID at [facility] specific inhibition.  F/u prn, consider inj vs hand referral. The patient voiced understanding and agreement to the plan.   [place], DO 03/11/20  11:26 AM

## 2020-05-08 DIAGNOSIS — D369 Benign neoplasm, unspecified site: Secondary | ICD-10-CM | POA: Diagnosis not present

## 2020-05-08 DIAGNOSIS — N952 Postmenopausal atrophic vaginitis: Secondary | ICD-10-CM | POA: Diagnosis not present

## 2020-05-08 DIAGNOSIS — Z01411 Encounter for gynecological examination (general) (routine) with abnormal findings: Secondary | ICD-10-CM | POA: Diagnosis not present

## 2020-06-03 ENCOUNTER — Other Ambulatory Visit: Payer: Self-pay | Admitting: Family Medicine

## 2020-06-03 DIAGNOSIS — Z1231 Encounter for screening mammogram for malignant neoplasm of breast: Secondary | ICD-10-CM

## 2020-06-13 ENCOUNTER — Ambulatory Visit
Admission: RE | Admit: 2020-06-13 | Discharge: 2020-06-13 | Disposition: A | Discharge status: Home / Self Care | Payer: 59 | Source: Ambulatory Visit | Attending: Family Medicine | Admitting: Family Medicine

## 2020-06-13 ENCOUNTER — Other Ambulatory Visit: Payer: Self-pay

## 2020-06-13 DIAGNOSIS — Z1231 Encounter for screening mammogram for malignant neoplasm of breast: Secondary | ICD-10-CM

## 2020-06-13 MED FILL — AMLODIPINE BESYLATE 5 MG TA: 5 | 90 days supply | Qty: 90 | Fill #1

## 2020-06-17 ENCOUNTER — Other Ambulatory Visit: Payer: Self-pay | Admitting: Family Medicine

## 2020-06-17 DIAGNOSIS — R928 Other abnormal and inconclusive findings on diagnostic imaging of breast: Secondary | ICD-10-CM

## 2020-07-03 ENCOUNTER — Other Ambulatory Visit: Payer: Self-pay

## 2020-07-03 ENCOUNTER — Ambulatory Visit: Payer: 59

## 2020-07-03 ENCOUNTER — Ambulatory Visit
Admission: RE | Admit: 2020-07-03 | Discharge: 2020-07-03 | Disposition: A | Discharge status: Home / Self Care | Payer: 59 | Source: Ambulatory Visit | Attending: Family Medicine | Admitting: Family Medicine

## 2020-07-03 DIAGNOSIS — R922 Inconclusive mammogram: Secondary | ICD-10-CM | POA: Diagnosis not present

## 2020-07-03 DIAGNOSIS — R928 Other abnormal and inconclusive findings on diagnostic imaging of breast: Secondary | ICD-10-CM

## 2020-08-01 DIAGNOSIS — H524 Presbyopia: Secondary | ICD-10-CM | POA: Diagnosis not present

## 2020-08-01 DIAGNOSIS — H40033 Anatomical narrow angle, bilateral: Secondary | ICD-10-CM | POA: Diagnosis not present

## 2020-09-27 HISTORY — PX: BREAST BIOPSY: SHX20

## 2020-10-10 MED FILL — AMLODIPINE BESYLATE 5 MG TA: 5 | 90 days supply | Qty: 90 | Fill #2

## 2020-12-12 ENCOUNTER — Other Ambulatory Visit: Payer: Self-pay | Admitting: Family Medicine

## 2020-12-12 ENCOUNTER — Encounter: Payer: Self-pay | Admitting: Family Medicine

## 2020-12-12 ENCOUNTER — Other Ambulatory Visit: Payer: Self-pay

## 2020-12-12 ENCOUNTER — Ambulatory Visit (INDEPENDENT_AMBULATORY_CARE_PROVIDER_SITE_OTHER): Payer: 59 | Admitting: Family Medicine

## 2020-12-12 VITALS — BP 122/74 | Temp 97.8°F | Resp 16 | Ht 66.0 in | Wt 135.0 lb

## 2020-12-12 DIAGNOSIS — Z2821 Immunization not carried out because of patient refusal: Secondary | ICD-10-CM

## 2020-12-12 DIAGNOSIS — Z5320 Procedure and treatment not carried out because of patient's decision for unspecified reasons: Secondary | ICD-10-CM | POA: Diagnosis not present

## 2020-12-12 DIAGNOSIS — I10 Essential (primary) hypertension: Secondary | ICD-10-CM | POA: Diagnosis not present

## 2020-12-12 DIAGNOSIS — Z0000 Encounter for general adult medical examination without abnormal findings: Secondary | ICD-10-CM | POA: Diagnosis not present

## 2020-12-12 DIAGNOSIS — R7303 Prediabetes: Secondary | ICD-10-CM

## 2020-12-12 LAB — COMPREHENSIVE METABOLIC PANEL
ALT: 18 U/L (ref 0–35)
AST: 17 U/L (ref 0–37)
Albumin: 4.6 g/dL (ref 3.5–5.2)
Alkaline Phosphatase: 67 U/L (ref 39–117)
BUN: 11 mg/dL (ref 6–23)
CO2: 29 mEq/L (ref 19–32)
Calcium: 9.7 mg/dL (ref 8.4–10.5)
Chloride: 102 mEq/L (ref 96–112)
Creatinine, Ser: 0.68 mg/dL (ref 0.40–1.20)
GFR: 93.12 mL/min (ref >60.00)
Glucose, Bld: 93 mg/dL (ref 70–99)
Potassium: 4.1 mEq/L (ref 3.5–5.1)
Sodium: 141 mEq/L (ref 135–145)
Total Bilirubin: 0.7 mg/dL (ref 0.2–1.2)
Total Protein: 7.9 g/dL (ref 6.0–8.3)

## 2020-12-12 LAB — CBC
HCT: 39 % (ref 36.0–46.0)
Hemoglobin: 13.3 g/dL (ref 12.0–15.0)
MCHC: 34.1 g/dL (ref 30.0–36.0)
MCV: 93.1 fl (ref 78.0–100.0)
Platelets: 280 10*3/uL (ref 150.0–400.0)
RBC: 4.19 Mil/uL (ref 3.87–5.11)
RDW: 13.2 % (ref 11.5–15.5)
WBC: 5.8 10*3/uL (ref 4.0–10.5)

## 2020-12-12 LAB — LIPID PANEL
Cholesterol: 236 mg/dL — ABNORMAL HIGH (ref 0–200)
HDL: 61.4 mg/dL (ref >39.00)
LDL Cholesterol: 143 mg/dL — ABNORMAL HIGH (ref 0–99)
NonHDL: 174.82
Total CHOL/HDL Ratio: 4
Triglycerides: 158 mg/dL — ABNORMAL HIGH (ref 0.0–149.0)
VLDL: 31.6 mg/dL (ref 0.0–40.0)

## 2020-12-12 LAB — HEMOGLOBIN A1C: Hgb A1c MFr Bld: 6.1 % (ref 4.6–6.5)

## 2020-12-12 MED ORDER — AMLODIPINE BESYLATE 5 MG PO TABS: 5.0000 mg | ORAL_TABLET | Freq: Every day | ORAL | 3 refills | Status: DC

## 2020-12-12 NOTE — Patient Instructions (Addendum)
Give [place] 2-3 business days to get the results of your labs back.   Keep the diet clean and stay active.  The new Shingrix vaccine (for shingles) is a 2 shot series. It can make people feel low energy, achy and almost like they have the flu for 48 hours after injection. Please plan accordingly when deciding on when to get this shot. Call our office for a nurse visit appointment to get this. The second shot of the series is less severe regarding the side effects, but it still lasts 48 hours.   Let me know if/when you are ready for a colonoscopy.   Send me a message with your covid shot dates please.   Let us know if you need anything.

## 2020-12-12 NOTE — Progress Notes (Signed)
Chief Complaint  Patient presents with  . Annual Exam     Well Woman Nancy Callahan is here for a complete physical.   Her last physical was >1 year ago.  Current diet: in general, a "healthy" diet. Current exercise: HIIT. Weight is stable and she denies fatigue out of ordinary. Seatbelt? Yes Loss of interested in doing things or depression in past 2 weeks? No  Health Maintenance Pap/HPV- Yes Mammogram- Yes Colon cancer screening-No Shingrix- No Tetanus- No Hep C screening- Yes HIV screening- Yes  Past Medical History:  Diagnosis Date  . Hypertension    on medication     Past Surgical History:  Procedure Laterality Date  . DILATION AND CURETTAGE OF UTERUS     spontaneous abortion  . ECTOPIC PREGNANCY SURGERY Right   . EXPLORATORY LAPAROTOMY     to evaluate infertility    Medications  Current Outpatient Medications on File Prior to Visit  Medication Sig Dispense Refill  . amLODipine (NORVASC) 5 MG tablet Take 1 tablet (5 mg total) by mouth daily. 90 tablet 3   Allergies Allergies  Allergen Reactions  . Actonel [Risedronate Sodium] Hives   Review of Systems: Constitutional:  no unexpected weight changes Eye:  no recent significant change in vision Ear/Nose/Mouth/Throat:  Ears:  no recent change in hearing Nose/Mouth/Throat:  no complaints of nasal congestion, no sore throat Cardiovascular: no chest pain Respiratory:  no shortness of breath Gastrointestinal:  no abdominal pain, no change in bowel habits GU:  Female: negative for dysuria or pelvic pain Musculoskeletal/Extremities:  no pain of the joints Integumentary (Skin/Breast):  no abnormal skin lesions reported Neurologic:  no headaches Endocrine:  denies fatigue  Exam BP 122/74   Temp 97.8 F (36.6 C)   Resp 16   Ht 5\' 6"  (1.676 m)   Wt 135 lb (61.2 kg)   LMP  (LMP Unknown)   BMI 21.79 kg/m  General:  well developed, well nourished, in no apparent distress Skin:  no significant moles, warts,  or growths Head:  no masses, lesions, or tenderness Eyes:  pupils equal and round, sclera anicteric without injection Ears:  canals without lesions, TMs shiny without retraction, no obvious effusion, no erythema Nose:  nares patent, septum midline, mucosa normal, and no drainage or sinus tenderness Throat/Pharynx:  lips and gingiva without lesion; tongue and uvula midline; non-inflamed pharynx; no exudates or postnasal drainage Neck: neck supple without adenopathy, thyromegaly, or masses Lungs:  clear to auscultation, breath sounds equal bilaterally, no respiratory distress Cardio:  regular rate and rhythm, no LE edema Abdomen:  abdomen soft, nontender; bowel sounds normal; no masses or organomegaly Genital: Defer to GYN Musculoskeletal:  symmetrical muscle groups noted without atrophy or deformity Extremities:  no clubbing, cyanosis, or edema, no deformities, no skin discoloration Neuro:  gait normal; deep tendon reflexes normal and symmetric Psych: well oriented with normal range of affect and appropriate judgment/insight  Assessment and Plan  Well adult exam - Plan: Comprehensive metabolic panel, CBC, Lipid panel  Prediabetes - Plan: Hemoglobin A1c  Essential hypertension - Plan: amLODipine (NORVASC) 5 MG tablet  Tetanus toxoid vaccination declined  Colon cancer screening declined   Well 65 y.o. female. Counseled on diet and exercise. Other orders as above.  Rec'd [facility]. Declined at this time. Will go to [place] in June for 3 weeks, will consider when she returns.  UTD w covid vaccine. Will update [place].  Rec'd Shingrix, she will find time in her sched. Declines Tdap.  Follow up in  6 mo or prn. The patient voiced understanding and agreement to the plan.  [place], DO 12/12/20 9:53 AM

## 2020-12-15 ENCOUNTER — Other Ambulatory Visit: Payer: Self-pay | Admitting: Family Medicine

## 2020-12-15 DIAGNOSIS — E785 Hyperlipidemia, unspecified: Secondary | ICD-10-CM

## 2021-01-26 ENCOUNTER — Other Ambulatory Visit (HOSPITAL_BASED_OUTPATIENT_CLINIC_OR_DEPARTMENT_OTHER): Payer: Self-pay

## 2021-01-26 MED FILL — Amlodipine Besylate Tab 5 MG (Base Equivalent): ORAL | 90 days supply | Qty: 90 | Fill #0 | Status: AC

## 2021-01-29 ENCOUNTER — Other Ambulatory Visit (INDEPENDENT_AMBULATORY_CARE_PROVIDER_SITE_OTHER): Payer: 59

## 2021-01-29 ENCOUNTER — Other Ambulatory Visit: Payer: Self-pay

## 2021-01-29 DIAGNOSIS — E785 Hyperlipidemia, unspecified: Secondary | ICD-10-CM

## 2021-01-29 LAB — LIPID PANEL
Cholesterol: 250 mg/dL — ABNORMAL HIGH (ref 0–200)
HDL: 69.3 mg/dL (ref >39.00)
LDL Cholesterol: 152 mg/dL — ABNORMAL HIGH (ref 0–99)
NonHDL: 180.41
Total CHOL/HDL Ratio: 4
Triglycerides: 141 mg/dL (ref 0.0–149.0)
VLDL: 28.2 mg/dL (ref 0.0–40.0)

## 2021-02-20 DIAGNOSIS — H40033 Anatomical narrow angle, bilateral: Secondary | ICD-10-CM | POA: Diagnosis not present

## 2021-02-20 DIAGNOSIS — H524 Presbyopia: Secondary | ICD-10-CM | POA: Diagnosis not present

## 2021-02-20 DIAGNOSIS — H5203 Hypermetropia, bilateral: Secondary | ICD-10-CM | POA: Diagnosis not present

## 2021-04-30 MED FILL — Amlodipine Besylate Tab 5 MG (Base Equivalent): ORAL | 90 days supply | Qty: 90 | Fill #1 | Status: AC

## 2021-05-01 ENCOUNTER — Other Ambulatory Visit (HOSPITAL_BASED_OUTPATIENT_CLINIC_OR_DEPARTMENT_OTHER): Payer: Self-pay

## 2021-05-27 ENCOUNTER — Telehealth: Payer: Self-pay | Admitting: Family Medicine

## 2021-05-27 NOTE — Telephone Encounter (Signed)
Patient called stating she's been having breast pain and tried to get her routine mammogram done early. She stated that imaging told her she needed Dr. Nani Ravens to send an order in order to scan her early. I offered to make her an appointment but she stated she only needs the orders not to see [place]. Please advise.

## 2021-05-27 NOTE — Telephone Encounter (Signed)
Called the patient informed of PCP instructions. Offered her an appointment today 05/27/21 or Friday 05/29/21. She is going to call her GYN first and if cannot get in soon will then call us back for an appointment.

## 2021-05-27 NOTE — Telephone Encounter (Signed)
Called left message to call back 

## 2021-05-27 NOTE — Telephone Encounter (Signed)
Would prefer she be evaluated by GYN or [place] for breast pain. Ty.

## 2021-06-05 ENCOUNTER — Other Ambulatory Visit: Payer: Self-pay | Admitting: Nurse Practitioner

## 2021-06-05 DIAGNOSIS — N644 Mastodynia: Secondary | ICD-10-CM

## 2021-06-20 ENCOUNTER — Other Ambulatory Visit: Payer: Self-pay

## 2021-06-20 ENCOUNTER — Other Ambulatory Visit: Payer: Self-pay | Admitting: Nurse Practitioner

## 2021-06-20 ENCOUNTER — Ambulatory Visit
Admission: RE | Admit: 2021-06-20 | Discharge: 2021-06-20 | Disposition: A | Discharge status: Home / Self Care | Payer: 59 | Source: Ambulatory Visit | Attending: Nurse Practitioner | Admitting: Nurse Practitioner

## 2021-06-20 DIAGNOSIS — N644 Mastodynia: Secondary | ICD-10-CM

## 2021-06-20 DIAGNOSIS — R922 Inconclusive mammogram: Secondary | ICD-10-CM | POA: Diagnosis not present

## 2021-06-20 DIAGNOSIS — N6489 Other specified disorders of breast: Secondary | ICD-10-CM

## 2021-07-02 ENCOUNTER — Other Ambulatory Visit: Payer: Self-pay | Admitting: Nurse Practitioner

## 2021-07-02 ENCOUNTER — Other Ambulatory Visit: Payer: Self-pay

## 2021-07-02 ENCOUNTER — Ambulatory Visit
Admission: RE | Admit: 2021-07-02 | Discharge: 2021-07-02 | Disposition: A | Discharge status: Home / Self Care | Payer: 59 | Source: Ambulatory Visit | Attending: Nurse Practitioner | Admitting: Nurse Practitioner

## 2021-07-02 DIAGNOSIS — R928 Other abnormal and inconclusive findings on diagnostic imaging of breast: Secondary | ICD-10-CM | POA: Diagnosis not present

## 2021-07-02 DIAGNOSIS — N6489 Other specified disorders of breast: Secondary | ICD-10-CM

## 2021-07-02 DIAGNOSIS — D369 Benign neoplasm, unspecified site: Secondary | ICD-10-CM | POA: Diagnosis not present

## 2021-07-02 DIAGNOSIS — Z124 Encounter for screening for malignant neoplasm of cervix: Secondary | ICD-10-CM | POA: Diagnosis not present

## 2021-07-02 DIAGNOSIS — N6012 Diffuse cystic mastopathy of left breast: Secondary | ICD-10-CM | POA: Diagnosis not present

## 2021-07-02 DIAGNOSIS — Z01419 Encounter for gynecological examination (general) (routine) without abnormal findings: Secondary | ICD-10-CM | POA: Diagnosis not present

## 2021-07-08 LAB — HM PAP SMEAR

## 2021-07-14 DIAGNOSIS — D369 Benign neoplasm, unspecified site: Secondary | ICD-10-CM | POA: Diagnosis not present

## 2021-07-24 DIAGNOSIS — N6092 Unspecified benign mammary dysplasia of left breast: Secondary | ICD-10-CM | POA: Diagnosis not present

## 2021-08-09 MED FILL — Amlodipine Besylate Tab 5 MG (Base Equivalent): ORAL | 90 days supply | Qty: 90 | Fill #2 | Status: AC

## 2021-08-10 ENCOUNTER — Other Ambulatory Visit (HOSPITAL_BASED_OUTPATIENT_CLINIC_OR_DEPARTMENT_OTHER): Payer: Self-pay

## 2021-08-28 DIAGNOSIS — H40033 Anatomical narrow angle, bilateral: Secondary | ICD-10-CM | POA: Diagnosis not present

## 2021-11-10 IMAGING — MG MM BREAST BX W LOC DEV 1ST LESION IMAGE BX SPEC STEREO GUIDE*L*
8 of 11 series · 8 of 23 positions shown · non-contrast
Comparison: Previous exams.
COMPARISON: Previous exams.

Addendum:
CLINICAL DATA: 65-year-old female presenting for biopsy of possible
distortion in the left breast.

EXAM:
LEFT BREAST STEREOTACTIC CORE NEEDLE BIOPSY

[L (1 of 8)]
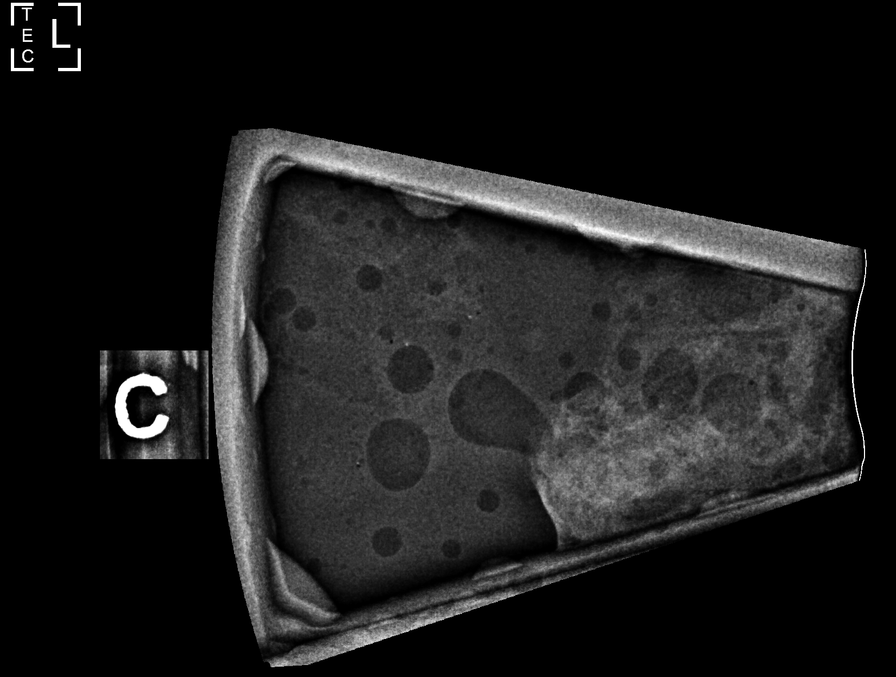

[L (2 of 8)]
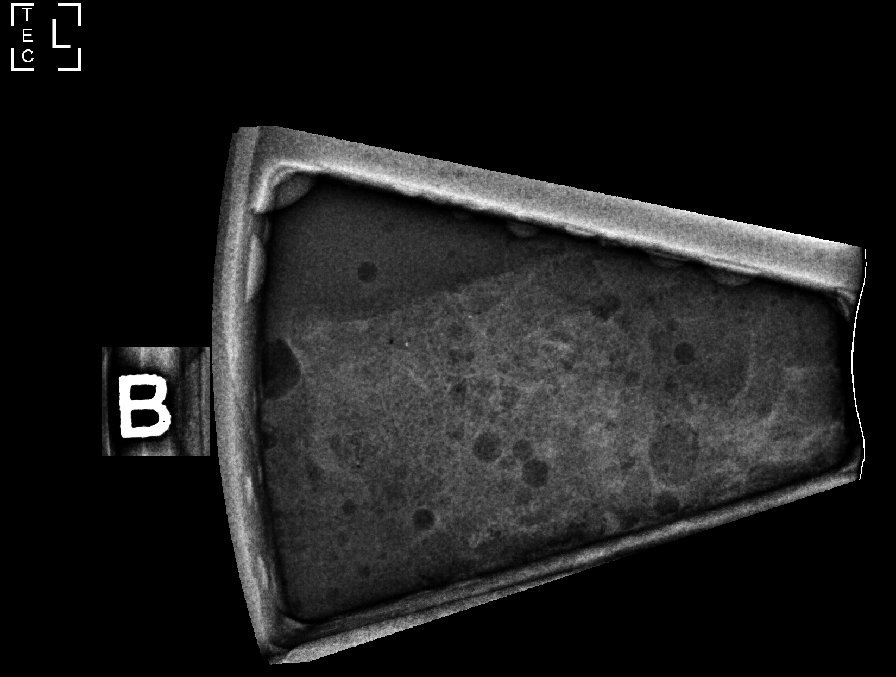

[L (3 of 8)]
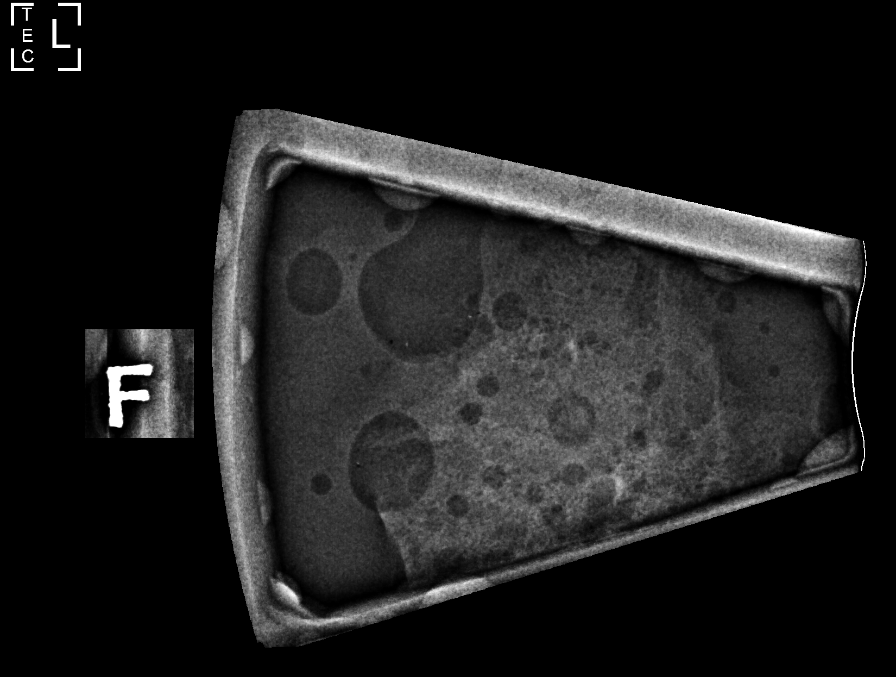

[L (4 of 8)]
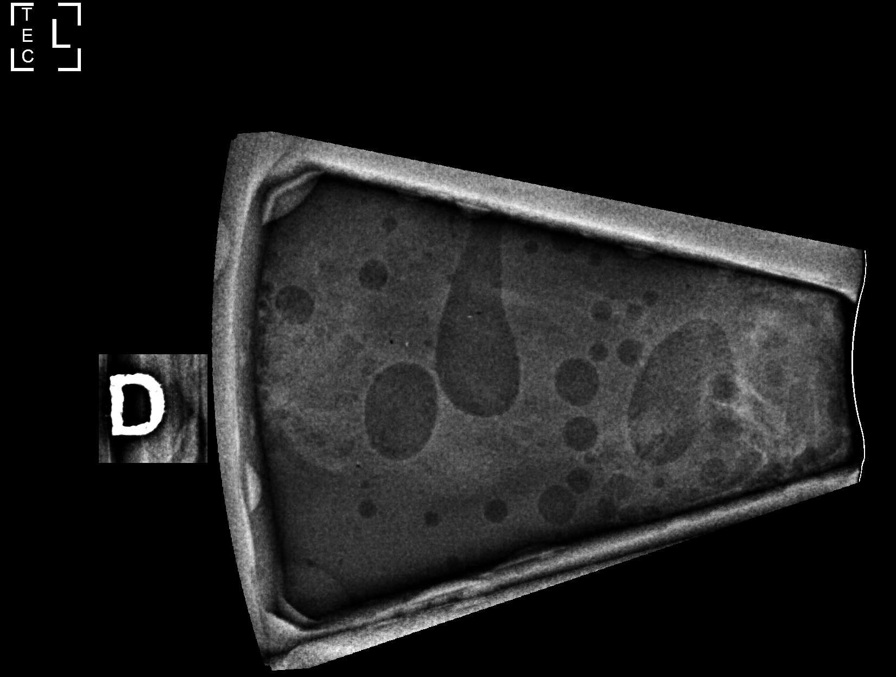

[L (5 of 8)]
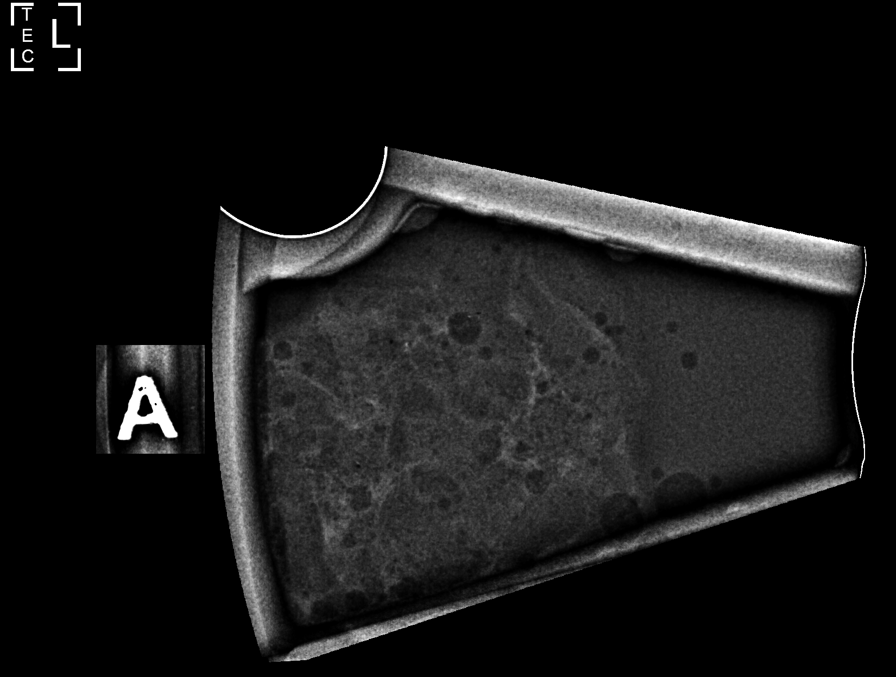

[L (6 of 8)]
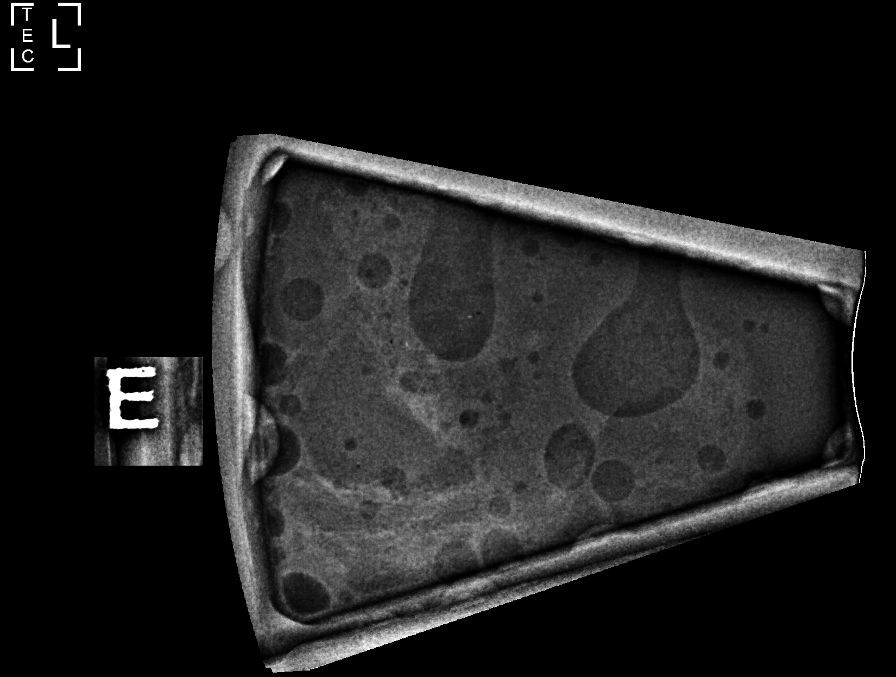

[L (7 of 8)]
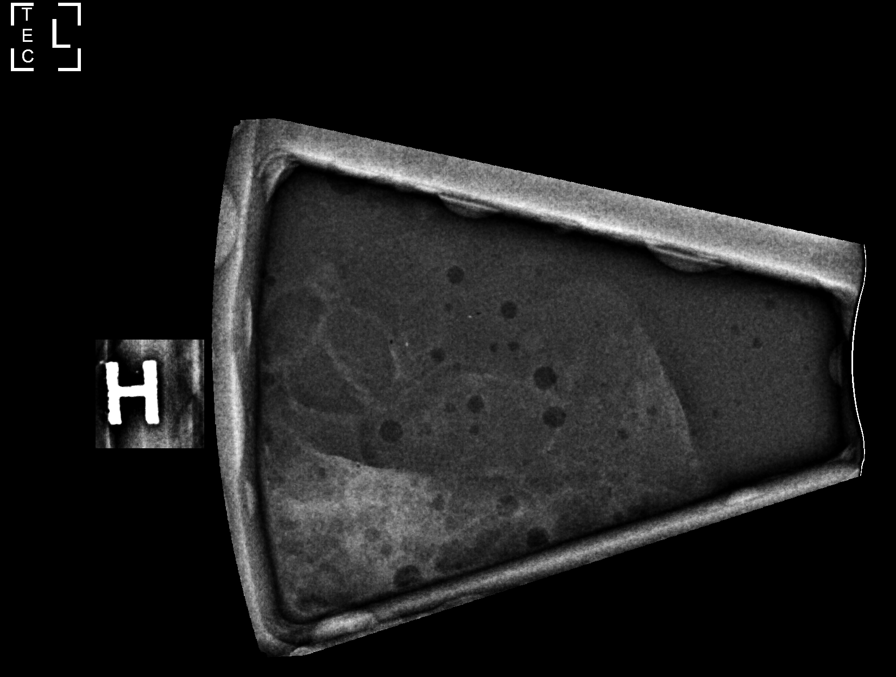

[L (8 of 8)]
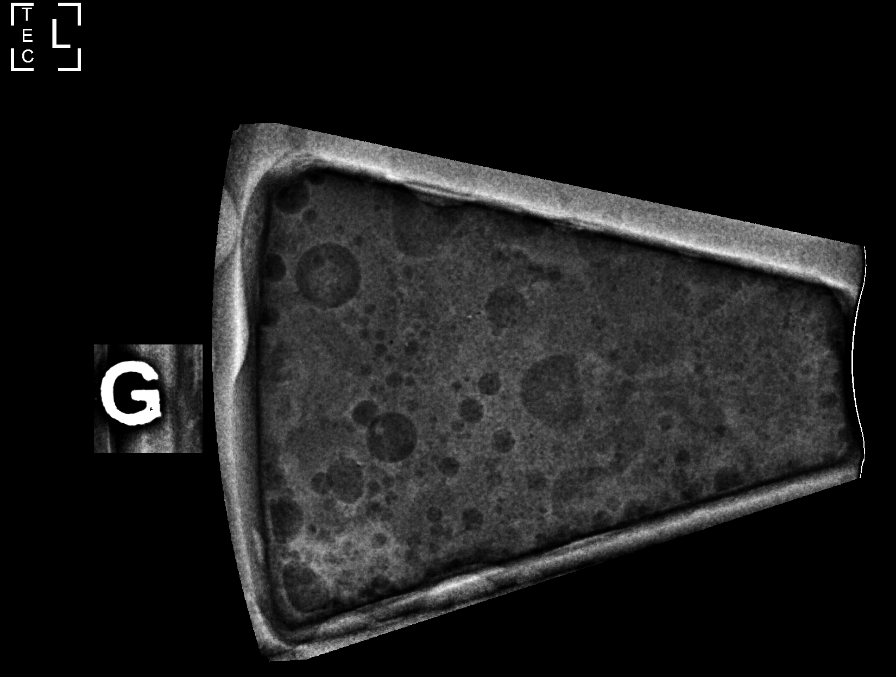

[8 of 23 positions shown; findings below may reference images not displayed]



Using sterile technique and 1% Lidocaine as local anesthetic, under
stereotactic guidance, a 9 gauge vacuum assisted device was used to
perform core needle biopsy of possible distortion in the upper outer
left breast using a lateral approach.

Lesion quadrant: Upper outer quadrant

At the conclusion of the procedure, an X shaped tissue marker clip
was deployed into the biopsy cavity. Follow-up 2-view mammogram was
performed and dictated separately.
IMPRESSION: Stereotactic-guided biopsy of possible distortion in the upper outer
left breast. No apparent complications.

ADDENDUM:
Pathology revealed LOBULAR NEOPLASIA (ATYPICAL LOBULAR HYPERPLASIA),
COLUMNAR CELL AND FIBROCYSTIC CHANGES of the Left breast, upper
outer, (x clip). This was found to be concordant by Dr. Alexis Burgos
Zeno with surgical consultation and High Risk Screening
recommended.

Pathology results were discussed with the patient by telephone. The
patient reported doing well after the biopsy with tenderness,
bleeding and swelling at the site. Post biopsy instructions and care
were reviewed and questions were answered. The patient was
encouraged to call The [REDACTED] for any
additional concerns. My direct number was provided.

Follow up protocol for patients with lobular neoplasia being
observed will have diagnostic mammograms for two years (6 month
follow up, 6 month follow up, 12 month follow up), then returned to
annual screening mammograms.

Surgical consultation has been arranged with Dr. Labelle Salha Brack Tiger at
[REDACTED] on July 17, 2021.

Pathology results reported by Kevcio Krystek, RN on 07/06/2021.



Using sterile technique and 1% Lidocaine as local anesthetic, under
stereotactic guidance, a 9 gauge vacuum assisted device was used to
perform core needle biopsy of possible distortion in the upper outer
left breast using a lateral approach.

Lesion quadrant: Upper outer quadrant

At the conclusion of the procedure, an X shaped tissue marker clip
was deployed into the biopsy cavity. Follow-up 2-view mammogram was
performed and dictated separately.
IMPRESSION: Stereotactic-guided biopsy of possible distortion in the upper outer
left breast. No apparent complications.

## 2021-11-10 IMAGING — MG MM BREAST LOCALIZATION CLIP
4 series · 4 of 12 positions shown · non-contrast
Comparison: Previous exam(s).

CLINICAL DATA: Post procedure mammogram for clip placement.

EXAM:
3D DIAGNOSTIC LEFT MAMMOGRAM POST STEREOTACTIC BIOPSY

[L ML synth-2D]
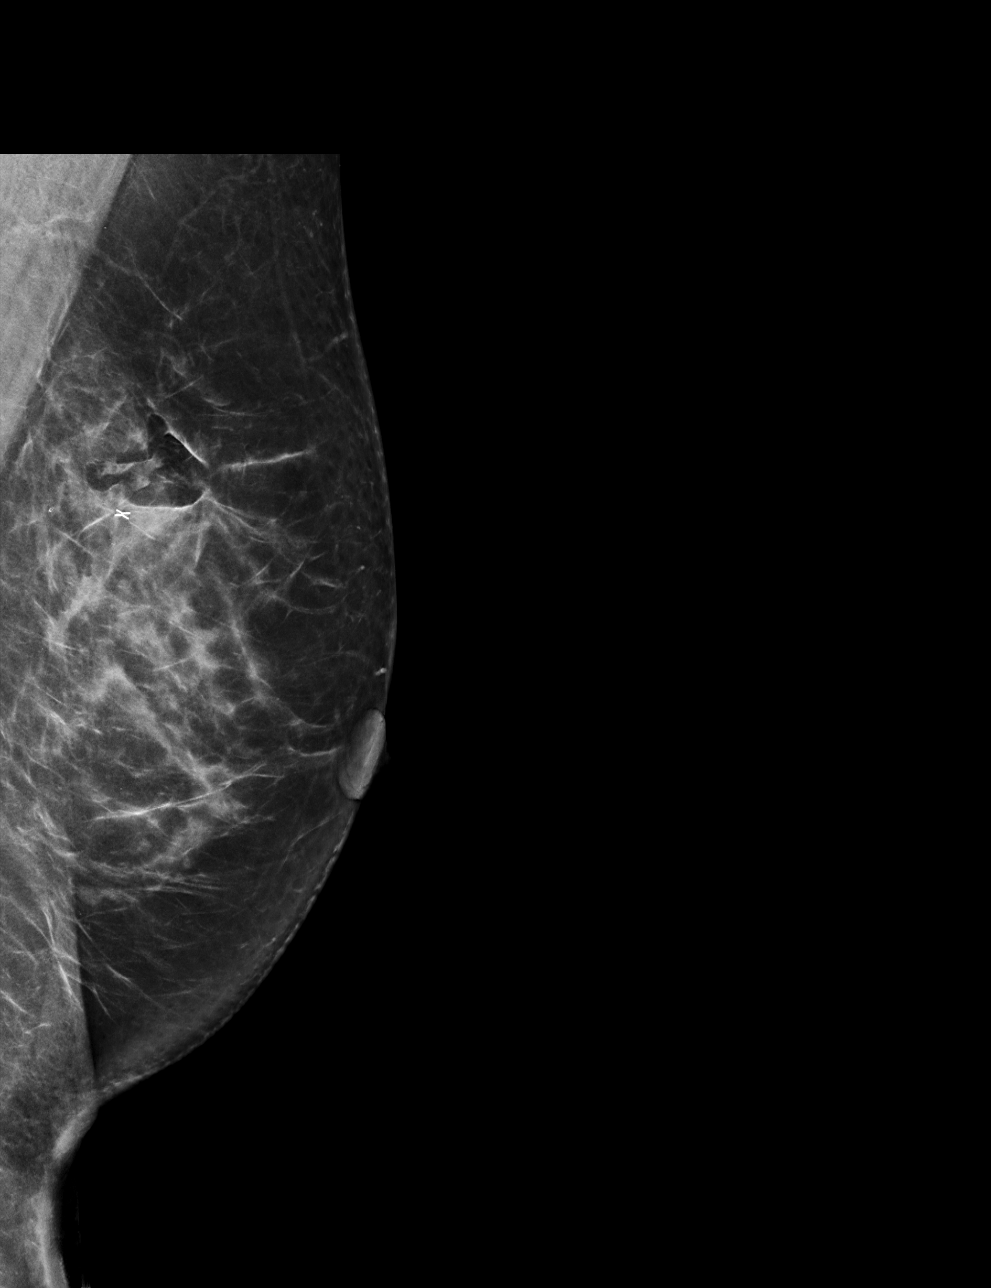

[L CC synth-2D]
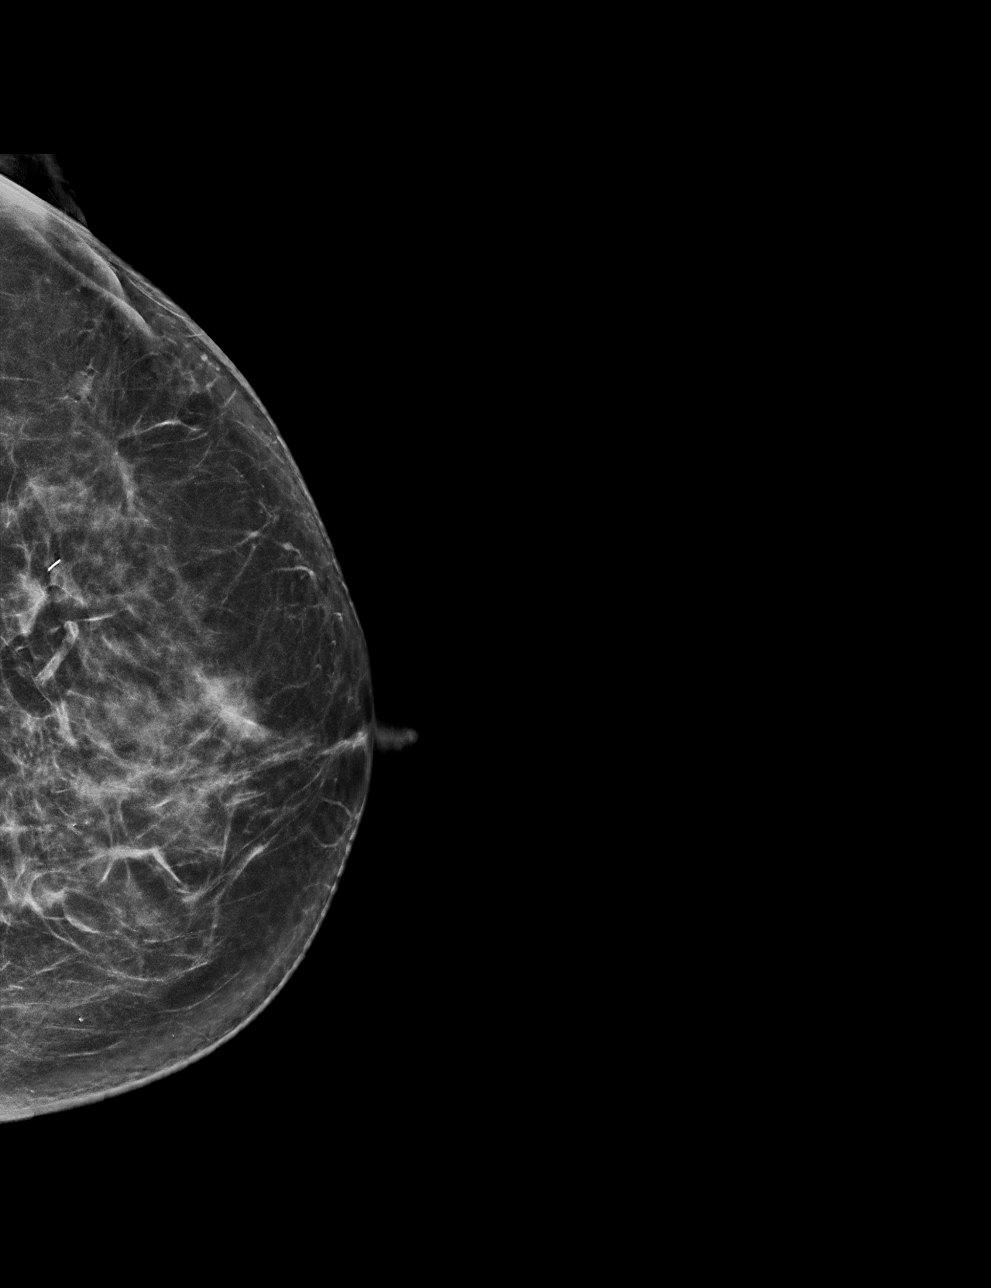

[L CC tomo · tomo slice 33/66.0]
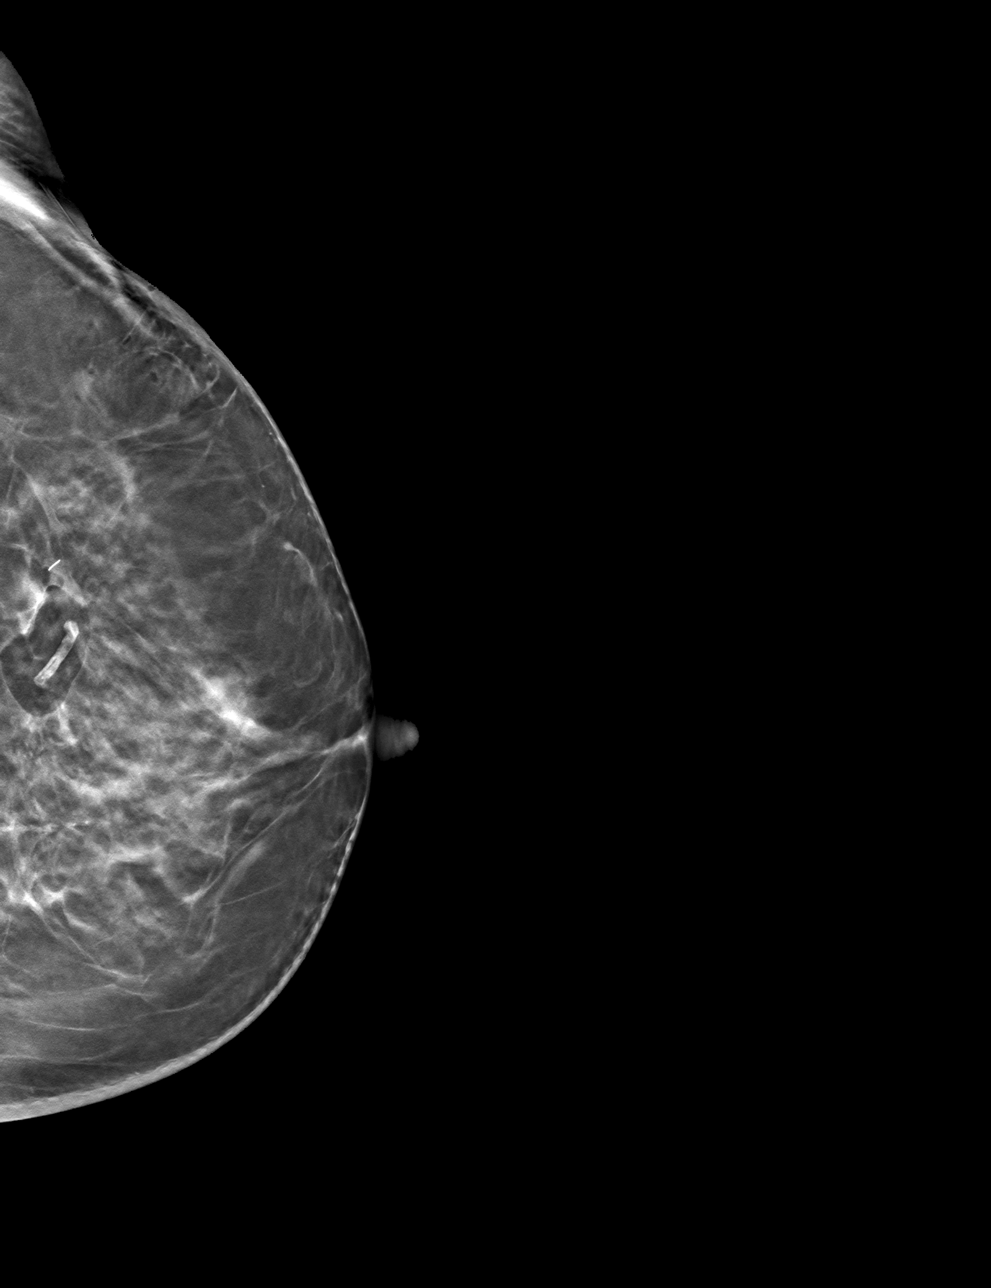

[L ML tomo · tomo slice 38/75.0]
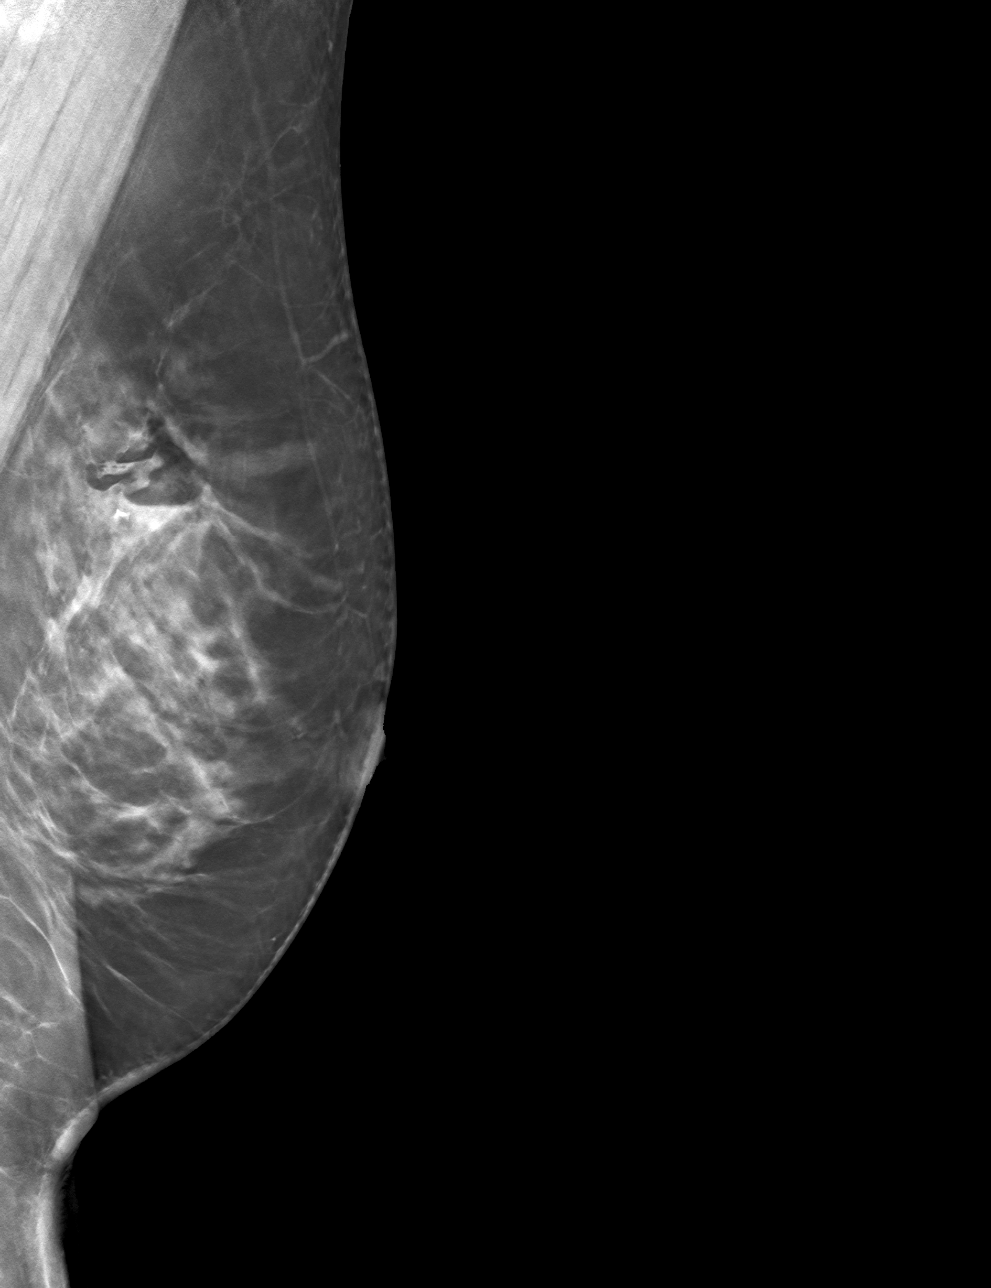

[4 of 12 positions shown; findings below may reference images not displayed]

FINDINGS: 3D Mammographic images were obtained following stereotactic guided
biopsy of possible distortion in the upper outer left breast. The X
biopsy marking clip is in expected position at the site of biopsy.
IMPRESSION: Appropriate positioning of the X shaped biopsy marking clip at the
site of biopsy in the upper outer left breast.

Final Assessment: Post Procedure Mammograms for Marker Placement

## 2021-11-18 ENCOUNTER — Other Ambulatory Visit (HOSPITAL_BASED_OUTPATIENT_CLINIC_OR_DEPARTMENT_OTHER): Payer: Self-pay

## 2021-11-18 MED FILL — Amlodipine Besylate Tab 5 MG (Base Equivalent): ORAL | 90 days supply | Qty: 90 | Fill #3 | Status: AC

## 2022-02-08 ENCOUNTER — Other Ambulatory Visit (HOSPITAL_BASED_OUTPATIENT_CLINIC_OR_DEPARTMENT_OTHER): Payer: Self-pay

## 2022-02-08 ENCOUNTER — Encounter: Payer: Self-pay | Admitting: Family Medicine

## 2022-02-08 ENCOUNTER — Ambulatory Visit (INDEPENDENT_AMBULATORY_CARE_PROVIDER_SITE_OTHER): Payer: 59 | Admitting: Family Medicine

## 2022-02-08 VITALS — BP 116/60 | HR 90 | Resp 20 | Ht 66.0 in | Wt 136.0 lb

## 2022-02-08 DIAGNOSIS — Z0000 Encounter for general adult medical examination without abnormal findings: Secondary | ICD-10-CM

## 2022-02-08 DIAGNOSIS — Z91199 Patient's noncompliance with other medical treatment and regimen due to unspecified reason: Secondary | ICD-10-CM | POA: Diagnosis not present

## 2022-02-08 DIAGNOSIS — I10 Essential (primary) hypertension: Secondary | ICD-10-CM

## 2022-02-08 DIAGNOSIS — Z1211 Encounter for screening for malignant neoplasm of colon: Secondary | ICD-10-CM

## 2022-02-08 MED ORDER — AMLODIPINE BESYLATE 5 MG PO TABS
5.0000 mg | ORAL_TABLET | Freq: Every day | ORAL | 3 refills | Status: DC
  Filled 2022-02-08: qty 90, 90d supply, fill #0
  Filled 2022-06-01: qty 90, 90d supply, fill #1
  Filled 2022-08-31: qty 90, 90d supply, fill #2
  Filled 2022-12-13: qty 90, 90d supply, fill #3

## 2022-02-08 NOTE — Progress Notes (Signed)
Chief Complaint  ?Patient presents with  ? Annual Exam  ?  ? ?Well Woman ?Nancy Callahan is here for a complete physical.   ?Her last physical was >1 year ago.  ?Current diet: in general, a "healthy" diet. ?Current exercise: cardio, weight lifting. ?Weight is stable and she denies fatigue out of ordinary. ?Seatbelt? Yes ?Advanced directive? No ? ?Health Maintenance ?Pap/HPV- Yes ?Mammogram- Yes ?Colon cancer screening-No ?Shingrix- No ?Tetanus- Declines ?Hep C screening- Yes ?HIV screening- Yes ? ?Past Medical History:  ?Diagnosis Date  ? Hypertension   ? on medication  ?  ? ?Past Surgical History:  ?Procedure Laterality Date  ? DILATION AND CURETTAGE OF UTERUS    ? spontaneous abortion  ? ECTOPIC PREGNANCY SURGERY Right   ? EXPLORATORY LAPAROTOMY    ? to evaluate infertility  ? ? ?Medications  ?Current Outpatient Medications on File Prior to Visit  ?Medication Sig Dispense Refill  ? amLODipine (NORVASC) 5 MG tablet TAKE 1 TABLET BY MOUTH ONCE DAILY 90 tablet 3  ? ? ?Allergies ?Allergies  ?Allergen Reactions  ? Actonel [Risedronate Sodium] Hives  ? ? ?Review of Systems: ?Constitutional:  no unexpected weight changes ?Eye:  no recent significant change in vision ?Ear/Nose/Mouth/Throat:  Ears:  no recent change in hearing ?Nose/Mouth/Throat:  no complaints of nasal congestion, no sore throat ?Cardiovascular: no chest pain ?Respiratory:  no shortness of breath ?Gastrointestinal:  no abdominal pain, no change in bowel habits ?GU:  Female: negative for dysuria or pelvic pain ?Musculoskeletal/Extremities: + Right elbow pain ?Integumentary (Skin/Breast):  no abnormal skin lesions reported ?Neurologic:  no headaches ?Endocrine:  denies fatigue ? ?Exam ?BP 116/60 (BP Location: Left Arm, Patient Position: Sitting, Cuff Size: Normal)   Pulse 90   Resp 20   Ht '5\' 6"'$  (1.676 m)   Wt 136 lb (61.7 kg)   LMP  (LMP Unknown)   SpO2 95%   BMI 21.95 kg/m?  ?General:  well developed, well nourished, in no apparent distress ?Skin:   no significant moles, warts, or growths ?Head:  no masses, lesions, or tenderness ?Eyes:  pupils equal and round, sclera anicteric without injection ?Ears:  canals without lesions, TMs shiny without retraction, no obvious effusion, no erythema ?Nose:  nares patent, septum midline, mucosa normal, and no drainage or sinus tenderness ?Throat/Pharynx:  lips and gingiva without lesion; tongue and uvula midline; non-inflamed pharynx; no exudates or postnasal drainage ?Neck: neck supple without adenopathy, thyromegaly, or masses ?Lungs:  clear to auscultation, breath sounds equal bilaterally, no respiratory distress ?Cardio:  regular rate and rhythm, no LE edema ?Abdomen:  abdomen soft, nontender; bowel sounds normal; no masses or organomegaly ?Genital: Defer to GYN ?Musculoskeletal:  symmetrical muscle groups noted without atrophy or deformity ?Extremities:  no clubbing, cyanosis, or edema, no deformities, no skin discoloration ?Neuro:  gait normal; deep tendon reflexes normal and symmetric ?Psych: well oriented with normal range of affect and appropriate judgment/insight ? ?Assessment and Plan ? ?Well adult exam - Plan: CBC, Comprehensive metabolic panel, Lipid panel ? ?Essential hypertension - Plan: amLODipine (NORVASC) 5 MG tablet ? ?Screening for colon cancer - Plan: Ambulatory referral to Gastroenterology ? ?Noncompliance by refusing service  ? ?Well 66 y.o. female. ?Counseled on diet and exercise. ?Other orders as above. ?Advanced directive form provided today.  ?Forearm strap and stretches and exercises for the right elbow.  Would consider physical therapy versus injection if no improvement in the next month.  Ice, Tylenol. ?Politely declines immunizations, Shingrix and tetanus shot recommended. ?Colon cancer screening  recommended, refer to gastroenterology.  She is in the precontemplative stage regarding this. ?Follow up in 6 mo or prn. ?The patient voiced understanding and agreement to the plan. ? ?Shelda Pal, DO ?02/08/22 ?3:18 PM ? ?

## 2022-02-08 NOTE — Patient Instructions (Addendum)
Give [place] 2-3 business days to get the results of your labs back.  ? ?Keep the diet clean and stay active. ? ?Please get me a copy of your advanced directive form at your convenience.  ? ?Consider getting a forearm strap to help your elbow heal. Any brand would work, but [employer] is an example.  ? ?Let us know if you need anything. ? ?Elbow and Forearm Exercises ?It is normal to feel mild stretching, pulling, tightness, or discomfort as you do these exercises, but you should stop right away if you feel sudden pain or your pain gets worse.  ?RANGE OF MOTION EXERCISES ?These exercises warm up your muscles and joints and improve the movement and flexibility of your injured elbow and forearm. These exercises also help to relieve pain, numbness, and tingling. These exercises are done using the muscles in your injured elbow and forearm. ?Exercise A: Elbow Flexion, Active ?Hold your left / right arm at your side, and bend your elbow as far as you can using your left / right arm muscles. ?Hold this position for 30 seconds. ?Slowly return to the starting position. ?Repeat 2 times. Complete this exercise 3 times per week. ?Exercise B: Elbow Extension, Active ?Hold your left / right arm at your side, and straighten your elbow as much as you can using your left / right arm muscles. ?Hold this position for 30 seconds. ?Slowly return to the starting position. ?Repeat 2 times. Complete this exercise 3 times per week. ?Exercise C: Forearm Rotation, Supination, Active ?Stand or sit with your elbows at your sides. ?Bend your left / right elbow to an "L" shape (90 degrees). ?Turn your palm upward until you feel a gentle stretch on the inside of your forearm. ?Hold this position for 30 seconds. ?Slowly release and return to the starting position. ?Repeat 2 times. Complete this exercise 3 times per week. ?Exercise D: Forearm Rotation, Pronation, Active ?Stand or sit with your elbows at your side. ?Bend your left / right elbow to an "L"  shape (90 degrees). ?Turn your left / right palm downward until you feel a gentle stretch on the top of your forearm. ?Hold this position for 30 seconds. ?Slowly release and return to the starting position. ?Repeat 2 times. Complete this exercise 3 times per week. ?STRETCHING EXERCISES ?These exercises warm up your muscles and joints and improve the movement and flexibility of your injured elbow and forearm. These exercises also help to relieve pain, numbness, and tingling. These exercises are done using your healthy elbow and forearm to help stretch the muscles in your injured elbow and forearm. ?Exercise E: Elbow Flexion, Active-Assisted ? ?Hold your left / right arm at your side, and bend your elbow as much as you can using your left / right arm muscles. ?Use your other hand to bend your left / right elbow farther. To do this, gently push up on your forearm until you feel a gentle stretch on the back of your elbow. ?Hold this position for 30 seconds. ?Slowly return to the starting position. ?Repeat 2 times. Complete this exercise 3 times per week. ?Exercise F: Elbow Extension, Active-Assisted ? ?Hold your left / right arm at your side, and straighten your elbow as much as you can using your left / right arm muscles. ?Use your other hand to straighten the left / right elbow farther. To do this, gently push down on your forearm until you feel a gentle stretch on the inside of your elbow. ?Hold this position for  30 seconds. ?Slowly return to the starting position. ?Repeat 2 times. Complete this exercise 3 times per weeky. ?Exercise G: Forearm Rotation, Supination, Active-Assisted ? ?Sit with your left / right elbow bent in an "L" shape (90 degrees) with your forearm resting on a table. ?Keeping your upper body and shoulder still, rotate your forearm so your left / right palm faces upward. ?Use your other hand to help rotate your forearm further until you feel a gentle to moderate stretch. ?Hold this position for 30  seconds. ?Slowly release the stretch and return to the starting position. ?Repeat 2 times. Complete this exercise 3 times per week. ?Exercise H: Forearm Rotation, Pronation, Active-Assisted ? ?Sit with your left / right elbow bent in an "L" shape (90 degrees) with your forearm resting on a table. ?Keeping your upper body and shoulder still, rotate your forearm so your palm faces the tabletop. ?Use your other hand to help rotate your forearm further until you feel a gentle to moderate stretch. ?Hold this position for 30 seconds. ?Slowly release the stretch and return to the starting position. ?Repeat 2 times. Complete this exercise 3 times per week. ?Exercise I: Elbow Flexion, Supine, Passive ?Lie on your back. ?Extend your left / right arm up in the air, bracing it with your other hand. ?Let your left / right your hand slowly lower toward your shoulder, while your elbow stays pointed toward the ceiling. You should feel a gentle stretch along the back of your upper arm and elbow. ?If instructed by your health care provider, you may increase the intensity of your stretch by adding a small wrist weight or hand weight. ?Hold this position for 3 seconds. ?Slowly return to the starting position. ?Repeat 2 times. Complete this exercise 3 times per week. ?Exercise J: Elbow Extension, Supine, Passive ? ?Lie on your back. Make sure that you are in a comfortable position that lets you relax your arm muscles. ?Place a folded towel under your left / right upper arm so your elbow and shoulder are at the same height. Straighten your left / right arm so your elbow does not rest on the bed or towel. ?Let the weight of your hand stretch your elbow. Keep your arm and chest muscles relaxed. You should feel a stretch on the inside of your elbow. ?If told by your health care provider, you may increase the intensity of your stretch by adding a small wrist weight or hand weight. ?Hold this position for 30 seconds. ?Slowly release the  stretch. ?Repeat 2 times. Complete this exercise 3 times per week. ?STRENGTHENING EXERCISES ?These exercises build strength and endurance in your elbow and forearm. Endurance is the ability to use your muscles for a long time, even after they get tired. ?Exercise K: Elbow Flexion, Isometric ? ?Stand or sit up straight. ?Bend your left / right elbow in an "L" shape (90 degrees) and turn your palm up so your forearm is at the height of your waist. ?Place your other hand on top of your forearm. Gently push down as your left / right arm resists. Push as hard as you can with both arms without causing any pain or movement at your left / right elbow. ?Hold this position for 3 seconds. ?Slowly release the tension in both arms. Let your muscles relax completely before repeating. ?Repeat 2 times. Complete this exercise 3 times per week. ?Exercise L: Elbow Extensors, Isometric ? ?Stand or sit up straight. ?Place your left / right arm so your palm faces  your abdomen and it is at the height of your waist. ?Place your other hand on the underside of your forearm. Gently push up as your left / right arm resists. Push as hard as you can with both arms, without causing any pain or movement at your left / right elbow. ?Hold this position for 3 seconds. ?Slowly release the tension in both arms. Let your muscles relax completely before repeating. ?Repeat _______2___ times. Complete this exercise 3 times per week. ?Exercise M: Elbow Flexion With Forearm Palm Up ? ?Sit upright on a firm chair without armrests, or stand. ?Place your left / right arm at your side with your palm facing forward. ?Holding a 5 lbweight or gripping a rubber exercise band or tubing, bend your elbow to bring your hand toward your shoulder. ?Hold this position for 3 seconds. ?Slowly return to the starting position. ?Repeat 2 times. Complete this exercise 3 times per week. ?Exercise N: Elbow Extension ? ?Sit on a firm chair without armrests, or stand. ?Keeping your  upper arms at your sides, bring both hands up toward your left / right shoulder while you grip a rubber exercise band or tubing. Your left / right hand should be just below the other hand. ?Straighten your le

## 2022-02-10 ENCOUNTER — Other Ambulatory Visit (INDEPENDENT_AMBULATORY_CARE_PROVIDER_SITE_OTHER): Payer: 59

## 2022-02-10 DIAGNOSIS — Z0000 Encounter for general adult medical examination without abnormal findings: Secondary | ICD-10-CM | POA: Diagnosis not present

## 2022-02-10 LAB — COMPREHENSIVE METABOLIC PANEL
ALT: 25 U/L (ref 0–35)
AST: 20 U/L (ref 0–37)
Albumin: 4.7 g/dL (ref 3.5–5.2)
Alkaline Phosphatase: 54 U/L (ref 39–117)
BUN: 16 mg/dL (ref 6–23)
CO2: 30 mEq/L (ref 19–32)
Calcium: 9.7 mg/dL (ref 8.4–10.5)
Chloride: 104 mEq/L (ref 96–112)
Creatinine, Ser: 0.87 mg/dL (ref 0.40–1.20)
GFR: 70.66 mL/min (ref >60.00)
Glucose, Bld: 99 mg/dL (ref 70–99)
Potassium: 4.1 mEq/L (ref 3.5–5.1)
Sodium: 141 mEq/L (ref 135–145)
Total Bilirubin: 0.6 mg/dL (ref 0.2–1.2)
Total Protein: 7.8 g/dL (ref 6.0–8.3)

## 2022-02-10 LAB — CBC
HCT: 37.6 % (ref 36.0–46.0)
Hemoglobin: 12.6 g/dL (ref 12.0–15.0)
MCHC: 33.6 g/dL (ref 30.0–36.0)
MCV: 94 fl (ref 78.0–100.0)
Platelets: 287 10*3/uL (ref 150.0–400.0)
RBC: 4 Mil/uL (ref 3.87–5.11)
RDW: 13.2 % (ref 11.5–15.5)
WBC: 4.8 10*3/uL (ref 4.0–10.5)

## 2022-02-10 LAB — LIPID PANEL
Cholesterol: 234 mg/dL — ABNORMAL HIGH (ref 0–200)
HDL: 59.2 mg/dL (ref >39.00)
LDL Cholesterol: 151 mg/dL — ABNORMAL HIGH (ref 0–99)
NonHDL: 174.64
Total CHOL/HDL Ratio: 4
Triglycerides: 116 mg/dL (ref 0.0–149.0)
VLDL: 23.2 mg/dL (ref 0.0–40.0)

## 2022-04-08 ENCOUNTER — Other Ambulatory Visit: Payer: Self-pay | Admitting: Obstetrics and Gynecology

## 2022-04-08 DIAGNOSIS — N649 Disorder of breast, unspecified: Secondary | ICD-10-CM

## 2022-04-21 ENCOUNTER — Ambulatory Visit
Admission: RE | Admit: 2022-04-21 | Discharge: 2022-04-21 | Disposition: A | Discharge status: Home / Self Care | Payer: 59 | Source: Ambulatory Visit | Attending: Obstetrics and Gynecology | Admitting: Obstetrics and Gynecology

## 2022-04-21 ENCOUNTER — Other Ambulatory Visit: Payer: Self-pay | Admitting: Obstetrics and Gynecology

## 2022-04-21 DIAGNOSIS — N6489 Other specified disorders of breast: Secondary | ICD-10-CM | POA: Diagnosis not present

## 2022-04-21 DIAGNOSIS — R922 Inconclusive mammogram: Secondary | ICD-10-CM | POA: Diagnosis not present

## 2022-04-21 DIAGNOSIS — N649 Disorder of breast, unspecified: Secondary | ICD-10-CM

## 2022-05-06 ENCOUNTER — Other Ambulatory Visit: Payer: Self-pay | Admitting: General Surgery

## 2022-05-06 DIAGNOSIS — N6092 Unspecified benign mammary dysplasia of left breast: Secondary | ICD-10-CM

## 2022-05-28 ENCOUNTER — Ambulatory Visit
Admission: RE | Admit: 2022-05-28 | Discharge: 2022-05-28 | Disposition: A | Discharge status: Home / Self Care | Payer: 59 | Source: Ambulatory Visit | Attending: General Surgery | Admitting: General Surgery

## 2022-05-28 DIAGNOSIS — N6489 Other specified disorders of breast: Secondary | ICD-10-CM | POA: Diagnosis not present

## 2022-05-28 DIAGNOSIS — N6092 Unspecified benign mammary dysplasia of left breast: Secondary | ICD-10-CM

## 2022-05-28 MED ORDER — GADOBUTROL 1 MMOL/ML IV SOLN
6.0000 mL | Freq: Once | INTRAVENOUS | Status: AC | PRN
Start: 2022-05-28 — End: 2022-05-28
  Administered 2022-05-28: 6 mL via INTRAVENOUS

## 2022-06-01 ENCOUNTER — Other Ambulatory Visit (HOSPITAL_BASED_OUTPATIENT_CLINIC_OR_DEPARTMENT_OTHER): Payer: Self-pay

## 2022-06-02 DIAGNOSIS — N6092 Unspecified benign mammary dysplasia of left breast: Secondary | ICD-10-CM | POA: Diagnosis not present

## 2022-06-02 DIAGNOSIS — Z803 Family history of malignant neoplasm of breast: Secondary | ICD-10-CM | POA: Diagnosis not present

## 2022-08-16 DIAGNOSIS — Z01419 Encounter for gynecological examination (general) (routine) without abnormal findings: Secondary | ICD-10-CM | POA: Diagnosis not present

## 2022-08-16 DIAGNOSIS — D369 Benign neoplasm, unspecified site: Secondary | ICD-10-CM | POA: Diagnosis not present

## 2022-08-31 ENCOUNTER — Other Ambulatory Visit (HOSPITAL_BASED_OUTPATIENT_CLINIC_OR_DEPARTMENT_OTHER): Payer: Self-pay

## 2022-09-13 DIAGNOSIS — D369 Benign neoplasm, unspecified site: Secondary | ICD-10-CM | POA: Diagnosis not present

## 2022-12-14 ENCOUNTER — Other Ambulatory Visit (HOSPITAL_BASED_OUTPATIENT_CLINIC_OR_DEPARTMENT_OTHER): Payer: Self-pay

## 2022-12-16 ENCOUNTER — Other Ambulatory Visit (HOSPITAL_BASED_OUTPATIENT_CLINIC_OR_DEPARTMENT_OTHER): Payer: Self-pay

## 2022-12-29 ENCOUNTER — Other Ambulatory Visit (HOSPITAL_BASED_OUTPATIENT_CLINIC_OR_DEPARTMENT_OTHER): Payer: Self-pay

## 2022-12-30 ENCOUNTER — Other Ambulatory Visit (HOSPITAL_BASED_OUTPATIENT_CLINIC_OR_DEPARTMENT_OTHER): Payer: Self-pay

## 2023-01-13 ENCOUNTER — Other Ambulatory Visit: Payer: Self-pay

## 2023-02-08 ENCOUNTER — Telehealth: Payer: Self-pay | Admitting: Family Medicine

## 2023-02-08 ENCOUNTER — Other Ambulatory Visit (HOSPITAL_BASED_OUTPATIENT_CLINIC_OR_DEPARTMENT_OTHER): Payer: Self-pay

## 2023-02-08 DIAGNOSIS — I10 Essential (primary) hypertension: Secondary | ICD-10-CM

## 2023-02-08 MED ORDER — AMLODIPINE BESYLATE 5 MG PO TABS
5.0000 mg | ORAL_TABLET | Freq: Every day | ORAL | 3 refills | Status: DC
  Filled 2023-02-08: qty 90, 90d supply, fill #0

## 2023-02-08 NOTE — Addendum Note (Signed)
Addended by: Scharlene Gloss B on: 02/08/2023 08:51 AM   Modules accepted: Orders

## 2023-02-08 NOTE — Telephone Encounter (Signed)
Prescription Request  02/08/2023  Is this a "Controlled Substance" medicine? No  LOV: Visit date not found  What is the name of the medication or equipment? amLODipine (NORVASC) 5 MG tablet   Have you contacted your pharmacy to request a refill? No   Which pharmacy would you like this sent to?  [facility] [address] [place] [address] Phone: [phone] Fax: [phone]    Patient notified that their request is being sent to the clinical staff for review and that they should receive a response within 2 business days.   Please advise at [facility] [phone]

## 2023-02-08 NOTE — Telephone Encounter (Signed)
Refill done.  

## 2023-02-08 NOTE — Telephone Encounter (Signed)
Pt appt has been scheduled.

## 2023-02-08 NOTE — Telephone Encounter (Signed)
Called left detailed message to call back---patient needs an appointment last OV was on 02/08/2022--needs appointment to be scheduled.

## 2023-02-23 ENCOUNTER — Other Ambulatory Visit (HOSPITAL_BASED_OUTPATIENT_CLINIC_OR_DEPARTMENT_OTHER): Payer: Self-pay

## 2023-02-23 ENCOUNTER — Other Ambulatory Visit: Payer: Self-pay | Admitting: Family Medicine

## 2023-02-23 ENCOUNTER — Encounter: Payer: Self-pay | Admitting: Family Medicine

## 2023-02-23 ENCOUNTER — Ambulatory Visit (INDEPENDENT_AMBULATORY_CARE_PROVIDER_SITE_OTHER): Payer: Commercial Managed Care - PPO | Admitting: Family Medicine

## 2023-02-23 VITALS — BP 120/80 | HR 65 | Temp 98.0°F | Ht 66.0 in | Wt 133.0 lb

## 2023-02-23 DIAGNOSIS — E2839 Other primary ovarian failure: Secondary | ICD-10-CM | POA: Diagnosis not present

## 2023-02-23 DIAGNOSIS — Z2821 Immunization not carried out because of patient refusal: Secondary | ICD-10-CM | POA: Diagnosis not present

## 2023-02-23 DIAGNOSIS — Z1211 Encounter for screening for malignant neoplasm of colon: Secondary | ICD-10-CM | POA: Diagnosis not present

## 2023-02-23 DIAGNOSIS — E7800 Pure hypercholesterolemia, unspecified: Secondary | ICD-10-CM

## 2023-02-23 DIAGNOSIS — Z0000 Encounter for general adult medical examination without abnormal findings: Secondary | ICD-10-CM

## 2023-02-23 DIAGNOSIS — I10 Essential (primary) hypertension: Secondary | ICD-10-CM | POA: Diagnosis not present

## 2023-02-23 LAB — CBC
HCT: 37 % (ref 36.0–46.0)
Hemoglobin: 12.3 g/dL (ref 12.0–15.0)
MCHC: 33.3 g/dL (ref 30.0–36.0)
MCV: 93 fl (ref 78.0–100.0)
Platelets: 316 10*3/uL (ref 150.0–400.0)
RBC: 3.97 Mil/uL (ref 3.87–5.11)
RDW: 13.7 % (ref 11.5–15.5)
WBC: 5 10*3/uL (ref 4.0–10.5)

## 2023-02-23 LAB — COMPREHENSIVE METABOLIC PANEL
ALT: 28 U/L (ref 0–35)
AST: 23 U/L (ref 0–37)
Albumin: 4.4 g/dL (ref 3.5–5.2)
Alkaline Phosphatase: 57 U/L (ref 39–117)
BUN: 12 mg/dL (ref 6–23)
CO2: 30 mEq/L (ref 19–32)
Calcium: 9.3 mg/dL (ref 8.4–10.5)
Chloride: 103 mEq/L (ref 96–112)
Creatinine, Ser: 0.69 mg/dL (ref 0.40–1.20)
GFR: 91.37 mL/min (ref >60.00)
Glucose, Bld: 99 mg/dL (ref 70–99)
Potassium: 3.7 mEq/L (ref 3.5–5.1)
Sodium: 141 mEq/L (ref 135–145)
Total Bilirubin: 0.7 mg/dL (ref 0.2–1.2)
Total Protein: 7.8 g/dL (ref 6.0–8.3)

## 2023-02-23 LAB — LIPID PANEL
Cholesterol: 225 mg/dL — ABNORMAL HIGH (ref 0–200)
HDL: 53.8 mg/dL (ref >39.00)
LDL Cholesterol: 139 mg/dL — ABNORMAL HIGH (ref 0–99)
NonHDL: 171.27
Total CHOL/HDL Ratio: 4
Triglycerides: 161 mg/dL — ABNORMAL HIGH (ref 0.0–149.0)
VLDL: 32.2 mg/dL (ref 0.0–40.0)

## 2023-02-23 MED ORDER — AMLODIPINE BESYLATE 5 MG PO TABS
5.0000 mg | ORAL_TABLET | Freq: Every day | ORAL | 1 refills | Status: DC
  Filled 2023-02-23 – 2023-02-24 (×2): qty 90, 90d supply, fill #0
  Filled 2023-06-20: qty 90, 90d supply, fill #1

## 2023-02-23 NOTE — Patient Instructions (Addendum)
Give [place] 2-3 business days to get the results of your labs back.   Keep the diet clean and stay active.  Please get me a copy of your advanced directive form at your convenience.   Please contact the GI team at: [phone]  The Shingrix vaccine (for shingles) is a 2 shot series spaced 2-6 months apart. It can make people feel low energy, achy and almost like they have the flu for 48 hours after injection. 1/5 people can have nausea and/or vomiting. Please plan accordingly when deciding on when to get this shot. Call our office for a nurse visit appointment to get this. The second shot of the series is less severe regarding the side effects, but it still lasts 48 hours.   Foods that may reduce pain: 1) Ginger 2) Blueberries 3) Salmon 4) Pumpkin seeds 5) dark chocolate 6) turmeric 7) tart cherries 8) virgin olive oil 9) chilli peppers 10) mint 11) krill oil  Let us know if you need anything.  Stretching and range of motion exercises These exercises warm up your muscles and joints and improve the movement and flexibility of your knee. These exercises also help to relieve pain and stiffness.  Exercise A: Knee flexion, active Lie on your back with both knees straight. If this causes back discomfort, bend your uninjured knee so your foot is flat on the floor. Slowly slide your left / right heel back toward your buttocks until you feel a gentle stretch in the front of your knee or thigh. Stop if you have pain. Hold for3 seconds. Slowly slide your left / right heel back to the starting position. 10 total repetitions. Repeat 2 times. Complete this exercise 3 times a week.  Exercise B: Knee extension, sitting Sit with your left / right heel propped on a chair, a coffee table, or a footstool. Do not have anything under your knee to support it. Allow your leg muscles to relax, letting gravity straighten out your knee. You should feel a stretch behind your left / right knee. If told by your  health care provide just above your kneecap. Hold this position for 3 seconds. Repeat for a total of 10 repetitions. Repeat 2 times. Complete this stretch 3 times a week.  Strengthening exercises These exercises build strength and endurance in your knee. Endurance is the ability to use your muscles for a long time, even after they get tired.  Exercise C: Quadriceps, isometric Lie on your back with your left / right leg extended and your other knee bent. Put a rolled towel or small pillow under your right/left knee if told by your health care provider. Slowly tense the muscles in the front of your left / right thigh by pushing the back of your knee down. You should see your knee cap slide up toward your hip or see increased dimpling just above the knee. For 3 seconds, keep the muscle as tight as you can without increasing your pain. Relax the muscles slowly and completely. Repeat for 10 total repetitions. Repeat 2 times. Complete this exercise 3 times a week. Exercise D: Straight leg raises (quadriceps) Lie on your back with your left / right leg extended and your other knee bent. Tense the muscles in the front of your left / right thigh. You should see your kneecap slide up or see increased dimpling just above the knee. Keep these muscles tight as you raise your leg 4-6 inches (10-15 cm) off the floor. Hold this position for 3 seconds.  Keep these muscles tense as you lower your leg. Relax the muscles slowly and completely. Repeat for a total of 10 repetitions. Repeat 2 times. Complete this exercise 3 times a week.  Exercise E: Hamstring curls On the floor or a bed, lie on your abdomen with your legs straight. Put a folded towel or small pillow under your left / right thigh, just above your kneecap. Slowly bend your left / right knee as far as you can without pain. Keep your hips flat against the floor or bed. Hold this position for 3 seconds. Slowly lower your leg to the starting position.  Repeat for a total of 10 repetitions. Repeat 2 times. Complete this exercise 3 times per week.  Stretching exercises These exercises warm up your muscles and joints and improve the movement and flexibility of your knee. These exercises also help to relieve pain and stiffness.  Exercise A: Quadriceps, prone Lie on your abdomen on a firm surface, such as a bed or padded floor. Bend your left / right knee and hold your ankle. If you cannot reach your ankle or pant leg, loop a belt around your foot and grab the belt instead. Gently pull your heel toward your buttocks. Your knee should not slide out to the side. You should feel a stretch in the front of your thigh and knee. Hold this position for 30 seconds. Repeat 2 times. Complete this stretch 3 times a week.  Exercise B: Hamstring, doorway Lie on your back in front of a doorway with your left / right leg resting against the wall and your other leg flat on the floor in the doorway. There should be a slight bend in your left / right knee. Straighten your left / right knee. You should feel a stretch behind your knee or thigh. If you do not feel that stretch, scoot your buttocks closer to the door. Hold this position for 30 seconds. Repeat 2 times. Complete this stretch 3 times a week.  Strengthening exercises These exercises build strength and endurance in your knee and leg muscles. Endurance is the ability to use your muscles for a long time, even after they get tired.   Exercise D: Wall slides (quadriceps) Lean your back against a smooth wall or door, and walk your feet out 18-24 inches (45-61 cm) from it. Place your feet hip-width apart. Slowly slide down the wall or door until your knees bend 90 degrees. Keep your knees over your heels, not over your toes. Keep your knees in line with your hips. Hold for 2 seconds. Stand up to rest for 60 seconds. Repeat 2 times. Complete this exercise 3 times a week.  Exercise E: Bridge (hip  extensors) Lie on your back on a firm surface with your knees bent and your feet flat on the floor. Tighten your buttocks muscles and lift your bottom off the floor until your trunk is level with your thighs. Do not arch your back. You should feel the muscles working in your buttocks and the back of your thighs. Hold this position for 2 seconds. Slowly lower your hips to the starting position. Let your buttocks muscles relax completely between repetitions. Repeat 2 times. Complete this exercise 3 times a week.

## 2023-02-23 NOTE — Addendum Note (Signed)
Addended by: Scharlene Gloss B on: 02/23/2023 04:16 PM   Modules accepted: Orders

## 2023-02-23 NOTE — Progress Notes (Signed)
Chief Complaint  Patient presents with   Annual Exam     Well Woman Nancy Callahan is here for a complete physical.   Her last physical was >1 year ago.  Current diet: in general, a "healthy" diet. Current exercise: [employer]. Weight is stable and she denies fatigue out of ordinary. Seatbelt? Yes Advanced directive? No  Health Maintenance Pap/HPV- Yes Mammogram- Yes Colon cancer screening-No- did not schedule Shingrix- No- refuses Tetanus- No- refuses Hep C screening- Yes HIV screening- Yes  Past Medical History:  Diagnosis Date   Hypertension    on medication     Past Surgical History:  Procedure Laterality Date   DILATION AND CURETTAGE OF UTERUS     spontaneous abortion   ECTOPIC PREGNANCY SURGERY Right    EXPLORATORY LAPAROTOMY     to evaluate infertility    Medications  Current Outpatient Medications on File Prior to Visit  Medication Sig Dispense Refill   amLODipine (NORVASC) 5 MG tablet Take 1 tablet (5 mg total) by mouth daily. 90 tablet 3   Allergies Allergies  Allergen Reactions   Actonel [Risedronate Sodium] Hives    Review of Systems: Constitutional:  no unexpected weight changes Eye:  no recent significant change in vision Ear/Nose/Mouth/Throat:  Ears:  no recent change in hearing Nose/Mouth/Throat:  no complaints of nasal congestion, no sore throat Cardiovascular: no chest pain Respiratory:  no shortness of breath Gastrointestinal:  no abdominal pain, no change in bowel habits GU:  Female: negative for dysuria or pelvic pain Musculoskeletal/Extremities: +knee pain Integumentary (Skin/Breast):  no abnormal skin lesions reported Neurologic:  no headaches Endocrine:  denies fatigue  Exam BP 120/80 (BP Location: Left Arm, Patient Position: Sitting, Cuff Size: Normal)   Pulse 65   Temp 98 F (36.7 C) (Oral)   Ht 5\' 6"  (1.676 m)   Wt 133 lb (60.3 kg)   LMP  (LMP Unknown)   SpO2 99%   BMI 21.47 kg/m  General:  well developed, well  nourished, in no apparent distress Skin:  no significant moles, warts, or growths Head:  no masses, lesions, or tenderness Eyes:  pupils equal and round, sclera anicteric without injection Ears:  canals without lesions, TMs shiny without retraction, no obvious effusion, no erythema Nose:  nares patent, mucosa normal, and no drainage  Throat/Pharynx:  lips and gingiva without lesion; tongue and uvula midline; non-inflamed pharynx; no exudates or postnasal drainage Neck: neck supple without adenopathy, thyromegaly, or masses Lungs:  clear to auscultation, breath sounds equal bilaterally, no respiratory distress Cardio:  regular rate and rhythm, no LE edema Abdomen:  abdomen soft, nontender; bowel sounds normal; no masses or organomegaly Genital: Defer to GYN Musculoskeletal: knees: nml active/passive ROM. Neg Lachman's, varus/valgus stress, patellar app/grind, Stines; no jt line ttp or effusion. Otherwise symmetrical muscle groups noted without atrophy or deformity Extremities:  no clubbing, cyanosis, or edema, no deformities, no skin discoloration Neuro:  gait normal; deep tendon reflexes normal and symmetric Psych: well oriented with normal range of affect and appropriate judgment/insight  Assessment and Plan  Well adult exam - Plan: CBC, Comprehensive metabolic panel, Lipid panel  Screen for colon cancer - Plan: Ambulatory referral to Gastroenterology  Estrogen deficiency - Plan: DG Bone Density  Essential hypertension - Plan: amLODipine (NORVASC) 5 MG tablet  Tetanus toxoid vaccination declined   Well 67 y.o. female. Counseled on diet and exercise. Advanced directive form provided today.  Shingrix and Tdap recommended. Both politely declined. CCS- ordered again. # provided in AVS  again.  Turns 67 in a few weeks. DEXA ordered. Discussed PCV20. She plans to call to sched this vaccination.  Other orders as above. Sounds like she may have some PFS pain. Will give some knee rehab  exercises/stretches, f/u prn if no better.  Follow up in 6 mo or prn. The patient voiced understanding and agreement to the plan.  Jilda Roche [place], DO 02/23/23 7:36 AM

## 2023-02-24 ENCOUNTER — Other Ambulatory Visit (HOSPITAL_BASED_OUTPATIENT_CLINIC_OR_DEPARTMENT_OTHER): Payer: Self-pay

## 2023-03-08 ENCOUNTER — Telehealth (HOSPITAL_BASED_OUTPATIENT_CLINIC_OR_DEPARTMENT_OTHER): Payer: Self-pay

## 2023-03-25 ENCOUNTER — Other Ambulatory Visit (INDEPENDENT_AMBULATORY_CARE_PROVIDER_SITE_OTHER): Payer: Commercial Managed Care - PPO

## 2023-03-25 DIAGNOSIS — E7800 Pure hypercholesterolemia, unspecified: Secondary | ICD-10-CM | POA: Diagnosis not present

## 2023-03-25 LAB — LIPID PANEL
Cholesterol: 211 mg/dL — ABNORMAL HIGH (ref 0–200)
HDL: 51.2 mg/dL (ref >39.00)
LDL Cholesterol: 126 mg/dL — ABNORMAL HIGH (ref 0–99)
NonHDL: 159.47
Total CHOL/HDL Ratio: 4
Triglycerides: 169 mg/dL — ABNORMAL HIGH (ref 0.0–149.0)
VLDL: 33.8 mg/dL (ref 0.0–40.0)

## 2023-05-03 ENCOUNTER — Ambulatory Visit (HOSPITAL_BASED_OUTPATIENT_CLINIC_OR_DEPARTMENT_OTHER)
Admission: RE | Admit: 2023-05-03 | Discharge: 2023-05-03 | Disposition: A | Discharge status: Home / Self Care | Payer: Commercial Managed Care - PPO | Source: Ambulatory Visit | Attending: Family Medicine | Admitting: Family Medicine

## 2023-05-03 ENCOUNTER — Other Ambulatory Visit: Payer: Self-pay | Admitting: Family Medicine

## 2023-05-03 DIAGNOSIS — M85852 Other specified disorders of bone density and structure, left thigh: Secondary | ICD-10-CM | POA: Diagnosis not present

## 2023-05-03 DIAGNOSIS — E2839 Other primary ovarian failure: Secondary | ICD-10-CM | POA: Diagnosis not present

## 2023-05-03 DIAGNOSIS — Z0000 Encounter for general adult medical examination without abnormal findings: Secondary | ICD-10-CM | POA: Insufficient documentation

## 2023-05-19 ENCOUNTER — Telehealth: Payer: Self-pay | Admitting: Family Medicine

## 2023-05-19 NOTE — Telephone Encounter (Signed)
Patient informed. Will send to [employer] to look at.

## 2023-05-19 NOTE — Telephone Encounter (Signed)
There is just a bone density scan. It's not diagnostic vs screening. If her insurance covers preventative studies, this is medically very obviously that. I'm not sure if there is another code that needs to be entered?

## 2023-05-19 NOTE — Telephone Encounter (Signed)
Pt said she was told she needed a bone density scan because of her age and she got a bill saying that it was a diagnostic bone density which means she gets charged [employer]. Pt said it should be screening for her insurance to cover. Pt wants to know why it was not filed as screening bone density test. Please call to advise

## 2023-05-20 NOTE — Telephone Encounter (Signed)
If ins will cover as osteoporosis screening, that is fine. Ty.

## 2023-05-20 NOTE — Telephone Encounter (Signed)
Did Dr. Carmelia Roller mean for this to get billed with Z00 code or was he looking for estrogen deficiency? If this was for estrogen deficiency it will be billed as diagnostic and not screening.

## 2023-05-27 ENCOUNTER — Telehealth: Payer: Self-pay | Admitting: Family Medicine

## 2023-05-27 NOTE — Telephone Encounter (Signed)
Pt asking about billing for bone density. Pt was informed we would call her when we figure out coding from radiology.

## 2023-06-09 NOTE — Telephone Encounter (Signed)
Would you mind placing a new order for this pt so that we can re file insurance with dx of Z00.00

## 2023-06-10 ENCOUNTER — Other Ambulatory Visit: Payer: Self-pay | Admitting: Family Medicine

## 2023-06-10 DIAGNOSIS — Z0000 Encounter for general adult medical examination without abnormal findings: Secondary | ICD-10-CM

## 2023-06-10 NOTE — Telephone Encounter (Signed)
What diagnosis is correct?

## 2023-06-16 NOTE — Telephone Encounter (Signed)
Pt called & wanted to speak with someone regarding this matter. She stated she still has a bill on her account. I informed pt that it seems that pcp corrected the code but she wanted more information. Please advise pt.

## 2023-06-20 ENCOUNTER — Other Ambulatory Visit: Payer: Self-pay

## 2023-06-20 ENCOUNTER — Ambulatory Visit (HOSPITAL_BASED_OUTPATIENT_CLINIC_OR_DEPARTMENT_OTHER)
Admission: RE | Admit: 2023-06-20 | Discharge: 2023-06-20 | Disposition: A | Discharge status: Home / Self Care | Payer: Commercial Managed Care - PPO | Source: Ambulatory Visit | Attending: Family Medicine | Admitting: Family Medicine

## 2023-06-20 DIAGNOSIS — Z0000 Encounter for general adult medical examination without abnormal findings: Secondary | ICD-10-CM | POA: Diagnosis not present

## 2023-06-20 DIAGNOSIS — M85852 Other specified disorders of bone density and structure, left thigh: Secondary | ICD-10-CM | POA: Diagnosis not present

## 2023-06-20 NOTE — Telephone Encounter (Signed)
Pt called back stating she has received another bill. I relayed your message to the pt & told her your waiting for a response from Radiology.

## 2023-07-19 DIAGNOSIS — H40013 Open angle with borderline findings, low risk, bilateral: Secondary | ICD-10-CM | POA: Diagnosis not present

## 2023-07-19 DIAGNOSIS — H524 Presbyopia: Secondary | ICD-10-CM | POA: Diagnosis not present

## 2023-07-19 DIAGNOSIS — H5202 Hypermetropia, left eye: Secondary | ICD-10-CM | POA: Diagnosis not present

## 2023-08-02 ENCOUNTER — Other Ambulatory Visit: Payer: Self-pay | Admitting: Obstetrics and Gynecology

## 2023-08-02 DIAGNOSIS — D369 Benign neoplasm, unspecified site: Secondary | ICD-10-CM | POA: Diagnosis not present

## 2023-08-02 DIAGNOSIS — Z01419 Encounter for gynecological examination (general) (routine) without abnormal findings: Secondary | ICD-10-CM | POA: Diagnosis not present

## 2023-08-02 DIAGNOSIS — R928 Other abnormal and inconclusive findings on diagnostic imaging of breast: Secondary | ICD-10-CM

## 2023-08-19 ENCOUNTER — Ambulatory Visit
Admission: RE | Admit: 2023-08-19 | Discharge: 2023-08-19 | Disposition: A | Discharge status: Home / Self Care | Payer: Commercial Managed Care - PPO | Source: Ambulatory Visit | Attending: Obstetrics and Gynecology | Admitting: Obstetrics and Gynecology

## 2023-08-19 DIAGNOSIS — R928 Other abnormal and inconclusive findings on diagnostic imaging of breast: Secondary | ICD-10-CM | POA: Diagnosis not present

## 2023-09-29 ENCOUNTER — Telehealth: Payer: Self-pay

## 2023-09-29 ENCOUNTER — Other Ambulatory Visit: Payer: Self-pay | Admitting: Family Medicine

## 2023-09-29 ENCOUNTER — Other Ambulatory Visit (HOSPITAL_BASED_OUTPATIENT_CLINIC_OR_DEPARTMENT_OTHER): Payer: Self-pay

## 2023-09-29 DIAGNOSIS — I10 Essential (primary) hypertension: Secondary | ICD-10-CM

## 2023-09-29 MED ORDER — AMLODIPINE BESYLATE 5 MG PO TABS
5.0000 mg | ORAL_TABLET | Freq: Every day | ORAL | 1 refills | Status: DC
  Filled 2023-09-29: qty 90, 90d supply, fill #0
  Filled 2023-12-22: qty 90, 90d supply, fill #1

## 2023-09-29 NOTE — Telephone Encounter (Signed)
 Copied from CRM [id number]. Topic: Clinical - Medication Refill >> Sep 29, 2023  9:23 AM Macario HERO wrote: Most Recent Primary Care Visit:  Provider: [facility]  Department: [facility]  Visit Type: LAB  Date: 03/25/2023  Medication: ***  Has the patient contacted their pharmacy?  (Agent: If no, request that the patient contact the pharmacy for the refill. If patient does not wish to contact the pharmacy document the reason why and proceed with request.) (Agent: If yes, when and what did the pharmacy advise?)  Is this the correct pharmacy for this prescription?  If no, delete pharmacy and type the correct one.  This is the patient's preferred pharmacy:  [facility] [place] [facility] [address] [place] [address] Phone: [phone] Fax: [phone]   Has the prescription been filled recently?   Is the patient out of the medication?   Has the patient been seen for an appointment in the last year OR does the patient have an upcoming appointment?   Can we respond through MyChart?   Agent: Please be advised that Rx refills may take up to 3 business days. We ask that you follow-up with your pharmacy.

## 2023-09-29 NOTE — Telephone Encounter (Signed)
 Rx sent    Copied from CRM [id number]. Topic: Clinical - Medication Refill >> Sep 29, 2023  9:23 AM Macario HERO wrote: Most Recent Primary Care Visit:  Provider: [facility]  Department: [facility]  Visit Type: LAB  Date: 03/25/2023  Medication: amLODipine  (NORVASC ) 5 MG tablet [[id number]]  Has the patient contacted their pharmacy? No (Agent: If no, request that the patient contact the pharmacy for the refill. If patient does not wish to contact the pharmacy document the reason why and proceed with request.) (Agent: If yes, when and what did the pharmacy advise?)  Is this the correct pharmacy for this prescription? Yes If no, delete pharmacy and type the correct one.  This is the patient's preferred pharmacy:  [facility] [place] [facility] [address], [address] [place] [address] Phone: [phone] Fax: [phone]   Has the prescription been filled recently? Yes  Is the patient out of the medication? No, 3-4 days left.  Has the patient been seen for an appointment in the last year OR does the patient have an upcoming appointment? Yes  Can we respond through MyChart? Yes  Agent: Please be advised that Rx refills may take up to 3 business days. We ask that you follow-up with your pharmacy.

## 2023-10-13 DIAGNOSIS — H40013 Open angle with borderline findings, low risk, bilateral: Secondary | ICD-10-CM | POA: Diagnosis not present

## 2024-02-24 ENCOUNTER — Encounter: Payer: Self-pay | Admitting: Family Medicine

## 2024-02-24 ENCOUNTER — Other Ambulatory Visit (HOSPITAL_BASED_OUTPATIENT_CLINIC_OR_DEPARTMENT_OTHER): Payer: Self-pay

## 2024-02-24 ENCOUNTER — Ambulatory Visit: Admitting: Family Medicine

## 2024-02-24 ENCOUNTER — Ambulatory Visit: Payer: Self-pay | Admitting: Family Medicine

## 2024-02-24 VITALS — BP 122/78 | HR 68 | Temp 98.0°F | Resp 16 | Ht 66.0 in | Wt 132.0 lb

## 2024-02-24 DIAGNOSIS — Z23 Encounter for immunization: Secondary | ICD-10-CM

## 2024-02-24 DIAGNOSIS — M8580 Other specified disorders of bone density and structure, unspecified site: Secondary | ICD-10-CM

## 2024-02-24 DIAGNOSIS — Z0000 Encounter for general adult medical examination without abnormal findings: Secondary | ICD-10-CM | POA: Diagnosis not present

## 2024-02-24 DIAGNOSIS — I10 Essential (primary) hypertension: Secondary | ICD-10-CM

## 2024-02-24 DIAGNOSIS — Z2821 Immunization not carried out because of patient refusal: Secondary | ICD-10-CM | POA: Diagnosis not present

## 2024-02-24 DIAGNOSIS — Z1211 Encounter for screening for malignant neoplasm of colon: Secondary | ICD-10-CM

## 2024-02-24 LAB — LIPID PANEL
Cholesterol: 264 mg/dL — ABNORMAL HIGH (ref 0–200)
HDL: 60.6 mg/dL (ref >39.00)
LDL Cholesterol: 178 mg/dL — ABNORMAL HIGH (ref 0–99)
NonHDL: 203.19
Total CHOL/HDL Ratio: 4
Triglycerides: 128 mg/dL (ref 0.0–149.0)
VLDL: 25.6 mg/dL (ref 0.0–40.0)

## 2024-02-24 LAB — COMPREHENSIVE METABOLIC PANEL WITH GFR
ALT: 27 U/L (ref 0–35)
AST: 23 U/L (ref 0–37)
Albumin: 4.9 g/dL (ref 3.5–5.2)
Alkaline Phosphatase: 60 U/L (ref 39–117)
BUN: 16 mg/dL (ref 6–23)
CO2: 29 meq/L (ref 19–32)
Calcium: 10 mg/dL (ref 8.4–10.5)
Chloride: 101 meq/L (ref 96–112)
Creatinine, Ser: 0.84 mg/dL (ref 0.40–1.20)
GFR: 72.65 mL/min (ref >60.00)
Glucose, Bld: 99 mg/dL (ref 70–99)
Potassium: 4.4 meq/L (ref 3.5–5.1)
Sodium: 141 meq/L (ref 135–145)
Total Bilirubin: 0.8 mg/dL (ref 0.2–1.2)
Total Protein: 8.3 g/dL (ref 6.0–8.3)

## 2024-02-24 LAB — CBC
HCT: 39.3 % (ref 36.0–46.0)
Hemoglobin: 13.2 g/dL (ref 12.0–15.0)
MCHC: 33.5 g/dL (ref 30.0–36.0)
MCV: 92.6 fl (ref 78.0–100.0)
Platelets: 316 10*3/uL (ref 150.0–400.0)
RBC: 4.24 Mil/uL (ref 3.87–5.11)
RDW: 12.7 % (ref 11.5–15.5)
WBC: 5.5 10*3/uL (ref 4.0–10.5)

## 2024-02-24 LAB — VITAMIN D 25 HYDROXY (VIT D DEFICIENCY, FRACTURES): VITD: 23.15 ng/mL — ABNORMAL LOW (ref 30.00–100.00)

## 2024-02-24 MED ORDER — AMLODIPINE BESYLATE 5 MG PO TABS
5.0000 mg | ORAL_TABLET | Freq: Every day | ORAL | 3 refills | Status: AC
  Filled 2024-02-24 – 2024-03-11 (×2): qty 90, 90d supply, fill #0
  Filled 2024-06-28: qty 90, 90d supply, fill #1
  Filled 2024-09-23: qty 90, 90d supply, fill #2

## 2024-02-24 NOTE — Patient Instructions (Addendum)
 Give us  2-3 business days to get the results of your labs back.   Keep the diet clean and stay active.  Please get me a copy of your advanced directive form at your convenience.   Someone will be in touch regarding your Cologuard.   The Shingrix vaccine (for shingles) is a 2 shot series spaced 2-6 months apart. It can make people feel low energy, achy and almost like they have the flu for 48 hours after injection. 1/5 people can have nausea and/or vomiting. Please plan accordingly when deciding on when to get this shot. Call our office for a nurse visit appointment to get this. The second shot of the series is less severe regarding the side effects, but it still lasts 48 hours.   Consider getting your tetanus booster.  Let us  know if you need anything.

## 2024-02-24 NOTE — Progress Notes (Signed)
 Chief Complaint  Patient presents with   Annual Exam    CPE     Well Woman Nancy Callahan is here for a complete physical.   Her last physical was >1 year ago.  Current diet: in general, diet is fair.  Current exercise: wt training, cardio. Weight is stable and she denies daytime fatigue. Seatbelt? Yes Advanced directive? No  Health Maintenance Colonoscopy- Due Shingrix- No DEXA- Yes Mammogram- Yes Tetanus- No Pneumonia- Due Hep C screen- Yes  Past Medical History:  Diagnosis Date   Hypertension    on medication     Past Surgical History:  Procedure Laterality Date   BREAST BIOPSY     DILATION AND CURETTAGE OF UTERUS     spontaneous abortion   ECTOPIC PREGNANCY SURGERY Right    EXPLORATORY LAPAROTOMY     to evaluate infertility    Medications  Current Outpatient Medications on File Prior to Visit  Medication Sig Dispense Refill   amLODipine  (NORVASC ) 5 MG tablet Take 1 tablet (5 mg total) by mouth daily. 90 tablet 1    Allergies Allergies  Allergen Reactions   Actonel [Risedronate Sodium] Hives    Review of Systems: Constitutional:  no fevers Eye:  no recent significant change in vision Ears:  No changes in hearing Nose/Mouth/Throat:  no complaints of nasal congestion, no sore throat Cardiovascular: no chest pain Respiratory:  No shortness of breath Gastrointestinal:  No change in bowel habits GU:  Female: negative for dysuria Integumentary:  no abnormal skin lesions reported Neurologic:  no headaches Endocrine:  denies unexplained weight changes  Exam BP 122/78 (BP Location: Left Arm, Patient Position: Sitting)   Pulse 68   Temp 98 F (36.7 C) (Oral)   Resp 16   Ht 5\' 6"  (1.676 m)   Wt 132 lb (59.9 kg)   LMP  (LMP Unknown)   SpO2 96%   BMI 21.31 kg/m  General:  well developed, well nourished, in no apparent distress Skin:  no significant moles, warts, or growths Head:  no masses, lesions, or tenderness Eyes:  pupils equal and round,  sclera anicteric without injection Ears:  canals without lesions, TMs shiny without retraction, no obvious effusion, no erythema Nose:  nares patent, mucosa normal, and no drainage Throat/Pharynx:  lips and gingiva without lesion; tongue and uvula midline; non-inflamed pharynx; no exudates or postnasal drainage Neck: neck supple without adenopathy, thyromegaly, or masses Lungs:  clear to auscultation, breath sounds equal bilaterally, no respiratory distress Cardio:  regular rate and rhythm, no bruits or LE edema Abdomen:  abdomen soft, nontender; bowel sounds normal; no masses or organomegaly Genital: Deferred Neuro:  gait normal; deep tendon reflexes normal and symmetric Psych: well oriented with normal range of affect and appropriate judgment/insight  Assessment and Plan  Well adult exam - Plan: CBC, Comprehensive metabolic panel with GFR, Lipid panel  Colon cancer screening - Plan: Cologuard  Tetanus toxoid vaccination declined   Well 68 y.o. female. Counseled on diet and exercise. Advanced directive form provided today.  Cologuard ordered for her.  Tdap politely declined.  Shingrix politely declined.  PCV20 today.  Had pap last Dec.  Other orders as above. Follow up in 6 mo. The patient voiced understanding and agreement to the plan.  Shellie Dials [place], DO 02/24/24 9:02 AM

## 2024-02-27 ENCOUNTER — Other Ambulatory Visit: Payer: Self-pay

## 2024-02-27 DIAGNOSIS — E7800 Pure hypercholesterolemia, unspecified: Secondary | ICD-10-CM

## 2024-02-27 DIAGNOSIS — R7989 Other specified abnormal findings of blood chemistry: Secondary | ICD-10-CM

## 2024-03-12 ENCOUNTER — Other Ambulatory Visit (HOSPITAL_BASED_OUTPATIENT_CLINIC_OR_DEPARTMENT_OTHER): Payer: Self-pay

## 2024-03-13 DIAGNOSIS — Z1211 Encounter for screening for malignant neoplasm of colon: Secondary | ICD-10-CM | POA: Diagnosis not present

## 2024-03-17 LAB — COLOGUARD: COLOGUARD: NEGATIVE

## 2024-04-13 ENCOUNTER — Other Ambulatory Visit (INDEPENDENT_AMBULATORY_CARE_PROVIDER_SITE_OTHER)

## 2024-04-13 DIAGNOSIS — E7800 Pure hypercholesterolemia, unspecified: Secondary | ICD-10-CM | POA: Diagnosis not present

## 2024-04-13 DIAGNOSIS — R7989 Other specified abnormal findings of blood chemistry: Secondary | ICD-10-CM

## 2024-04-13 LAB — LIPID PANEL
Cholesterol: 229 mg/dL — ABNORMAL HIGH (ref 0–200)
HDL: 47 mg/dL (ref >39.00)
LDL Cholesterol: 147 mg/dL — ABNORMAL HIGH (ref 0–99)
NonHDL: 182.09
Total CHOL/HDL Ratio: 5
Triglycerides: 176 mg/dL — ABNORMAL HIGH (ref 0.0–149.0)
VLDL: 35.2 mg/dL (ref 0.0–40.0)

## 2024-04-13 LAB — VITAMIN D 25 HYDROXY (VIT D DEFICIENCY, FRACTURES): VITD: 24.83 ng/mL — ABNORMAL LOW (ref 30.00–100.00)

## 2024-04-15 ENCOUNTER — Ambulatory Visit: Payer: Self-pay | Admitting: Family Medicine

## 2024-04-15 MED ORDER — VITAMIN D (ERGOCALCIFEROL) 1.25 MG (50000 UNIT) PO CAPS
50000.0000 [IU] | ORAL_CAPSULE | ORAL | 0 refills | Status: AC
Start: 2024-04-15 — End: ?
  Filled 2024-04-15: qty 12, 84d supply, fill #0

## 2024-04-16 ENCOUNTER — Other Ambulatory Visit (HOSPITAL_BASED_OUTPATIENT_CLINIC_OR_DEPARTMENT_OTHER): Payer: Self-pay

## 2024-04-27 ENCOUNTER — Other Ambulatory Visit (HOSPITAL_BASED_OUTPATIENT_CLINIC_OR_DEPARTMENT_OTHER): Payer: Self-pay

## 2024-07-09 ENCOUNTER — Telehealth: Payer: Self-pay | Admitting: Family Medicine

## 2024-07-09 ENCOUNTER — Other Ambulatory Visit: Payer: Self-pay

## 2024-07-09 DIAGNOSIS — R7989 Other specified abnormal findings of blood chemistry: Secondary | ICD-10-CM

## 2024-07-09 NOTE — Telephone Encounter (Signed)
 Hey have Pt with no orders may you please send orders. Thank you

## 2024-07-13 ENCOUNTER — Ambulatory Visit: Payer: Self-pay | Admitting: Family Medicine

## 2024-07-13 ENCOUNTER — Other Ambulatory Visit (INDEPENDENT_AMBULATORY_CARE_PROVIDER_SITE_OTHER)

## 2024-07-13 DIAGNOSIS — R7989 Other specified abnormal findings of blood chemistry: Secondary | ICD-10-CM

## 2024-07-13 LAB — VITAMIN D 25 HYDROXY (VIT D DEFICIENCY, FRACTURES): VITD: 33.52 ng/mL (ref 30.00–100.00)

## 2024-07-26 DIAGNOSIS — H40013 Open angle with borderline findings, low risk, bilateral: Secondary | ICD-10-CM | POA: Diagnosis not present

## 2024-07-26 DIAGNOSIS — H524 Presbyopia: Secondary | ICD-10-CM | POA: Diagnosis not present

## 2024-08-30 ENCOUNTER — Other Ambulatory Visit: Payer: Self-pay

## 2024-08-30 DIAGNOSIS — Z1231 Encounter for screening mammogram for malignant neoplasm of breast: Secondary | ICD-10-CM

## 2024-08-31 ENCOUNTER — Inpatient Hospital Stay: Admission: RE | Admit: 2024-08-31 | Discharge: 2024-08-31

## 2024-08-31 ENCOUNTER — Ambulatory Visit: Admitting: Family Medicine

## 2024-08-31 DIAGNOSIS — Z1231 Encounter for screening mammogram for malignant neoplasm of breast: Secondary | ICD-10-CM
